# Patient Record
Sex: Male | Born: 1960 | Race: Black or African American | Hispanic: No | Marital: Single | Smoking: Former smoker
Health system: Southern US, Community
[De-identification: ages and names within clinical notes are randomized; demographics above are authoritative.]

## PROBLEM LIST (undated history)

## (undated) DIAGNOSIS — K759 Inflammatory liver disease, unspecified: Secondary | ICD-10-CM

## (undated) DIAGNOSIS — F1010 Alcohol abuse, uncomplicated: Secondary | ICD-10-CM

## (undated) DIAGNOSIS — I10 Essential (primary) hypertension: Secondary | ICD-10-CM

## (undated) DIAGNOSIS — K7460 Unspecified cirrhosis of liver: Secondary | ICD-10-CM

## (undated) DIAGNOSIS — M109 Gout, unspecified: Secondary | ICD-10-CM

## (undated) DIAGNOSIS — M1990 Unspecified osteoarthritis, unspecified site: Secondary | ICD-10-CM

## (undated) HISTORY — PX: COLONOSCOPY: SHX174

## (undated) HISTORY — DX: Unspecified cirrhosis of liver: K74.60

## (undated) HISTORY — PX: CERVICAL FUSION: SHX112

## (undated) HISTORY — DX: Inflammatory liver disease, unspecified: K75.9

## (undated) HISTORY — DX: Unspecified osteoarthritis, unspecified site: M19.90

## (undated) HISTORY — PX: POLYPECTOMY: SHX149

## (undated) HISTORY — DX: Alcohol abuse, uncomplicated: F10.10

---

## 1999-12-24 ENCOUNTER — Encounter: Payer: Self-pay | Admitting: Emergency Medicine

## 1999-12-24 ENCOUNTER — Emergency Department (HOSPITAL_COMMUNITY): Admission: EM | Admit: 1999-12-24 | Discharge: 1999-12-24 | Payer: Self-pay | Admitting: Emergency Medicine

## 2000-06-21 ENCOUNTER — Emergency Department (HOSPITAL_COMMUNITY): Admission: EM | Admit: 2000-06-21 | Discharge: 2000-06-21 | Payer: Self-pay | Admitting: *Deleted

## 2007-04-21 ENCOUNTER — Emergency Department (HOSPITAL_COMMUNITY): Admission: EM | Admit: 2007-04-21 | Discharge: 2007-04-21 | Payer: Self-pay | Admitting: Emergency Medicine

## 2007-04-27 ENCOUNTER — Emergency Department (HOSPITAL_COMMUNITY): Admission: EM | Admit: 2007-04-27 | Discharge: 2007-04-27 | Payer: Self-pay | Admitting: Emergency Medicine

## 2007-08-24 ENCOUNTER — Emergency Department (HOSPITAL_COMMUNITY): Admission: EM | Admit: 2007-08-24 | Discharge: 2007-08-24 | Payer: Self-pay | Admitting: Family Medicine

## 2007-09-04 ENCOUNTER — Emergency Department (HOSPITAL_COMMUNITY): Admission: EM | Admit: 2007-09-04 | Discharge: 2007-09-04 | Payer: Self-pay | Admitting: Emergency Medicine

## 2008-06-30 ENCOUNTER — Ambulatory Visit: Payer: Self-pay | Admitting: Family Medicine

## 2008-06-30 ENCOUNTER — Ambulatory Visit: Payer: Self-pay | Admitting: *Deleted

## 2008-06-30 LAB — CONVERTED CEMR LAB
ALT: 92 units/L — ABNORMAL HIGH (ref 0–53)
AST: 70 units/L — ABNORMAL HIGH (ref 0–37)
Albumin: 3.7 g/dL (ref 3.5–5.2)
Alkaline Phosphatase: 113 units/L (ref 39–117)
Amphetamine Screen, Ur: NEGATIVE
BUN: 11 mg/dL (ref 6–23)
Barbiturate Quant, Ur: NEGATIVE
Basophils Absolute: 0 10*3/uL (ref 0.0–0.1)
Basophils Relative: 1 % (ref 0–1)
Benzodiazepines.: NEGATIVE
CO2: 22 meq/L (ref 19–32)
Calcium: 9.1 mg/dL (ref 8.4–10.5)
Chloride: 107 meq/L (ref 96–112)
Cocaine Metabolites: POSITIVE — AB
Creatinine, Ser: 1.43 mg/dL (ref 0.40–1.50)
Creatinine,U: 228.2 mg/dL
Eosinophils Absolute: 0.4 10*3/uL (ref 0.0–0.7)
Eosinophils Relative: 6 % — ABNORMAL HIGH (ref 0–5)
Glucose, Bld: 87 mg/dL (ref 70–99)
HCT: 44.2 % (ref 39.0–52.0)
Hemoglobin: 14.8 g/dL (ref 13.0–17.0)
Lymphocytes Relative: 49 % — ABNORMAL HIGH (ref 12–46)
Lymphs Abs: 3.6 10*3/uL (ref 0.7–4.0)
MCHC: 33.5 g/dL (ref 30.0–36.0)
MCV: 85.8 fL (ref 78.0–100.0)
Marijuana Metabolite: NEGATIVE
Methadone: NEGATIVE
Monocytes Absolute: 0.7 10*3/uL (ref 0.1–1.0)
Monocytes Relative: 9 % (ref 3–12)
Neutro Abs: 2.6 10*3/uL (ref 1.7–7.7)
Neutrophils Relative %: 36 % — ABNORMAL LOW (ref 43–77)
Opiate Screen, Urine: NEGATIVE
Phencyclidine (PCP): NEGATIVE
Platelets: 266 10*3/uL (ref 150–400)
Potassium: 4.6 meq/L (ref 3.5–5.3)
Propoxyphene: NEGATIVE
RBC: 5.15 M/uL (ref 4.22–5.81)
RDW: 13.6 % (ref 11.5–15.5)
Sed Rate: 2 mm/hr (ref 0–16)
Sodium: 142 meq/L (ref 135–145)
TSH: 1.417 microintl units/mL (ref 0.350–4.50)
Total Bilirubin: 0.5 mg/dL (ref 0.3–1.2)
Total Protein: 7.1 g/dL (ref 6.0–8.3)
Uric Acid, Serum: 8.4 mg/dL — ABNORMAL HIGH (ref 4.0–7.8)
WBC: 7.4 10*3/uL (ref 4.0–10.5)

## 2008-07-03 ENCOUNTER — Encounter: Payer: Self-pay | Admitting: Family Medicine

## 2008-07-03 LAB — CONVERTED CEMR LAB
HCV Ab: REACTIVE — AB
Hep B S Ab: NEGATIVE
Hepatitis B Surface Ag: NEGATIVE

## 2008-08-06 ENCOUNTER — Ambulatory Visit: Payer: Self-pay | Admitting: Internal Medicine

## 2008-08-06 ENCOUNTER — Encounter: Payer: Self-pay | Admitting: Family Medicine

## 2008-08-06 LAB — CONVERTED CEMR LAB
BUN: 8 mg/dL (ref 6–23)
CO2: 22 meq/L (ref 19–32)
Calcium: 9.2 mg/dL (ref 8.4–10.5)
Chloride: 104 meq/L (ref 96–112)
Cholesterol: 180 mg/dL (ref 0–200)
Creatinine, Ser: 1.17 mg/dL (ref 0.40–1.50)
Glucose, Bld: 102 mg/dL — ABNORMAL HIGH (ref 70–99)
HDL: 51 mg/dL (ref >39)
LDL Cholesterol: 93 mg/dL (ref 0–99)
Potassium: 3.5 meq/L (ref 3.5–5.3)
Sodium: 139 meq/L (ref 135–145)
Total CHOL/HDL Ratio: 3.5
Triglycerides: 178 mg/dL — ABNORMAL HIGH (ref <150)
VLDL: 36 mg/dL (ref 0–40)

## 2008-12-05 ENCOUNTER — Ambulatory Visit: Payer: Self-pay | Admitting: Internal Medicine

## 2009-09-25 ENCOUNTER — Ambulatory Visit: Payer: Self-pay | Admitting: Family Medicine

## 2009-10-26 ENCOUNTER — Ambulatory Visit: Payer: Self-pay | Admitting: Internal Medicine

## 2009-10-26 LAB — CONVERTED CEMR LAB
ALT: 99 units/L — ABNORMAL HIGH (ref 0–53)
AST: 84 units/L — ABNORMAL HIGH (ref 0–37)
Albumin: 3.9 g/dL (ref 3.5–5.2)
Alkaline Phosphatase: 89 units/L (ref 39–117)
Amphetamine Screen, Ur: NEGATIVE
BUN: 15 mg/dL (ref 6–23)
Barbiturate Quant, Ur: NEGATIVE
Basophils Absolute: 0.1 10*3/uL (ref 0.0–0.1)
Basophils Relative: 1 % (ref 0–1)
Benzodiazepines.: NEGATIVE
CO2: 22 meq/L (ref 19–32)
Calcium: 9.4 mg/dL (ref 8.4–10.5)
Chloride: 105 meq/L (ref 96–112)
Cholesterol: 119 mg/dL (ref 0–200)
Cocaine Metabolites: NEGATIVE
Creatinine, Ser: 1.19 mg/dL (ref 0.40–1.50)
Creatinine,U: 228.5 mg/dL
Eosinophils Absolute: 0.2 10*3/uL (ref 0.0–0.7)
Eosinophils Relative: 4 % (ref 0–5)
Glucose, Bld: 106 mg/dL — ABNORMAL HIGH (ref 70–99)
HCT: 44.3 % (ref 39.0–52.0)
HDL: 37 mg/dL — ABNORMAL LOW (ref >39)
Hemoglobin: 15.3 g/dL (ref 13.0–17.0)
LDL Cholesterol: 43 mg/dL (ref 0–99)
Lymphocytes Relative: 52 % — ABNORMAL HIGH (ref 12–46)
Lymphs Abs: 3.2 10*3/uL (ref 0.7–4.0)
MCHC: 34.5 g/dL (ref 30.0–36.0)
MCV: 82.3 fL (ref 78.0–100.0)
Marijuana Metabolite: NEGATIVE
Methadone: NEGATIVE
Monocytes Absolute: 0.5 10*3/uL (ref 0.1–1.0)
Monocytes Relative: 8 % (ref 3–12)
Neutro Abs: 2.3 10*3/uL (ref 1.7–7.7)
Neutrophils Relative %: 36 % — ABNORMAL LOW (ref 43–77)
Opiate Screen, Urine: NEGATIVE
Phencyclidine (PCP): NEGATIVE
Platelets: 241 10*3/uL (ref 150–400)
Potassium: 4.2 meq/L (ref 3.5–5.3)
Propoxyphene: NEGATIVE
RBC: 5.38 M/uL (ref 4.22–5.81)
RDW: 12.5 % (ref 11.5–15.5)
Sodium: 140 meq/L (ref 135–145)
TSH: 3.154 microintl units/mL (ref 0.350–4.500)
Total Bilirubin: 0.5 mg/dL (ref 0.3–1.2)
Total CHOL/HDL Ratio: 3.2
Total Protein: 7.8 g/dL (ref 6.0–8.3)
Triglycerides: 195 mg/dL — ABNORMAL HIGH (ref <150)
Uric Acid, Serum: 8 mg/dL — ABNORMAL HIGH (ref 4.0–7.8)
VLDL: 39 mg/dL (ref 0–40)
WBC: 6.2 10*3/uL (ref 4.0–10.5)

## 2009-11-18 ENCOUNTER — Ambulatory Visit: Payer: Self-pay | Admitting: Family Medicine

## 2009-12-02 ENCOUNTER — Ambulatory Visit: Payer: Self-pay | Admitting: Family Medicine

## 2009-12-02 LAB — CONVERTED CEMR LAB
AST: 107 units/L — ABNORMAL HIGH (ref 0–37)
HDL: 39 mg/dL — ABNORMAL LOW (ref >39)
LDL Cholesterol: 85 mg/dL (ref 0–99)
Total CHOL/HDL Ratio: 3.8
VLDL: 25 mg/dL (ref 0–40)

## 2009-12-10 ENCOUNTER — Ambulatory Visit: Payer: Self-pay | Admitting: Family Medicine

## 2009-12-24 ENCOUNTER — Ambulatory Visit: Payer: Self-pay | Admitting: Family Medicine

## 2009-12-24 LAB — CONVERTED CEMR LAB
AST: 89 units/L — ABNORMAL HIGH (ref 0–37)
Albumin: 4.1 g/dL (ref 3.5–5.2)
Alkaline Phosphatase: 92 units/L (ref 39–117)
Barbiturate Quant, Ur: NEGATIVE
Cocaine Metabolites: POSITIVE — AB
Creatinine,U: 352.3 mg/dL
Opiate Screen, Urine: NEGATIVE
Total Bilirubin: 0.6 mg/dL (ref 0.3–1.2)
Total Protein: 8.5 g/dL — ABNORMAL HIGH (ref 6.0–8.3)

## 2010-01-04 ENCOUNTER — Ambulatory Visit: Payer: Self-pay | Admitting: Internal Medicine

## 2010-01-14 ENCOUNTER — Ambulatory Visit: Payer: Self-pay | Admitting: Family Medicine

## 2010-01-18 ENCOUNTER — Ambulatory Visit: Payer: Self-pay | Admitting: Internal Medicine

## 2010-03-03 ENCOUNTER — Ambulatory Visit: Payer: Self-pay | Admitting: Family Medicine

## 2010-03-29 ENCOUNTER — Ambulatory Visit: Payer: Self-pay | Admitting: Family Medicine

## 2010-04-26 ENCOUNTER — Ambulatory Visit: Payer: Self-pay | Admitting: Internal Medicine

## 2010-04-26 ENCOUNTER — Encounter (INDEPENDENT_AMBULATORY_CARE_PROVIDER_SITE_OTHER): Payer: Self-pay | Admitting: Adult Health

## 2010-04-26 LAB — CONVERTED CEMR LAB
ALT: 96 units/L — ABNORMAL HIGH (ref 0–53)
CO2: 22 meq/L (ref 19–32)
Creatinine, Ser: 1 mg/dL (ref 0.40–1.50)
HCV Ab: REACTIVE — AB
Hep A IgM: NEGATIVE
Hep B C IgM: NEGATIVE
Hepatitis B Surface Ag: NEGATIVE
Total Bilirubin: 0.4 mg/dL (ref 0.3–1.2)
Uric Acid, Serum: 8.2 mg/dL — ABNORMAL HIGH (ref 4.0–7.8)

## 2010-05-27 ENCOUNTER — Ambulatory Visit: Payer: Self-pay | Admitting: Family Medicine

## 2010-11-02 ENCOUNTER — Encounter (INDEPENDENT_AMBULATORY_CARE_PROVIDER_SITE_OTHER): Payer: Self-pay | Admitting: Internal Medicine

## 2010-11-02 LAB — CONVERTED CEMR LAB
ALT: 126 units/L — ABNORMAL HIGH (ref 0–53)
AST: 110 units/L — ABNORMAL HIGH (ref 0–37)
Alkaline Phosphatase: 91 units/L (ref 39–117)
Chloride: 105 meq/L (ref 96–112)
Creatinine, Ser: 1.25 mg/dL (ref 0.40–1.50)
Total Bilirubin: 0.5 mg/dL (ref 0.3–1.2)

## 2010-11-05 ENCOUNTER — Ambulatory Visit (HOSPITAL_COMMUNITY)
Admission: RE | Admit: 2010-11-05 | Discharge: 2010-11-05 | Payer: Self-pay | Source: Home / Self Care | Attending: Family Medicine | Admitting: Family Medicine

## 2010-11-26 ENCOUNTER — Encounter (INDEPENDENT_AMBULATORY_CARE_PROVIDER_SITE_OTHER): Payer: Self-pay | Admitting: *Deleted

## 2010-11-26 LAB — CONVERTED CEMR LAB
CO2: 26 meq/L (ref 19–32)
Chloride: 103 meq/L (ref 96–112)
Glucose, Bld: 83 mg/dL (ref 70–99)
Magnesium: 1.9 mg/dL (ref 1.5–2.5)
Potassium: 4.5 meq/L (ref 3.5–5.3)
Sodium: 138 meq/L (ref 135–145)
Total CK: 279 units/L — ABNORMAL HIGH (ref 7–232)

## 2010-12-22 ENCOUNTER — Encounter: Payer: Self-pay | Admitting: Internal Medicine

## 2010-12-22 LAB — CONVERTED CEMR LAB
CRP: 0.1 mg/dL (ref <0.6)
Sed Rate: 5 mm/hr (ref 0–16)
TSH: 1.311 microintl units/mL (ref 0.350–4.500)

## 2011-03-30 ENCOUNTER — Inpatient Hospital Stay (INDEPENDENT_AMBULATORY_CARE_PROVIDER_SITE_OTHER)
Admission: RE | Admit: 2011-03-30 | Discharge: 2011-03-30 | Disposition: A | Discharge status: Home / Self Care | Payer: Medicaid Other | Source: Ambulatory Visit | Attending: Family Medicine | Admitting: Family Medicine

## 2011-03-30 DIAGNOSIS — G5600 Carpal tunnel syndrome, unspecified upper limb: Secondary | ICD-10-CM

## 2011-03-30 DIAGNOSIS — I10 Essential (primary) hypertension: Secondary | ICD-10-CM

## 2011-09-01 LAB — I-STAT 8, (EC8 V) (CONVERTED LAB)
BUN: 9
Chloride: 105
HCT: 49
Operator id: 116391
Potassium: 4.3
pCO2, Ven: 47.1
pH, Ven: 7.376 — ABNORMAL HIGH

## 2012-01-25 IMAGING — CR DG KNEE 1-2V*L*
2 series · 2 of 2 positions shown · non-contrast
Comparison: None.

CLINICAL DATA: Chronic knee pain.  History of gout.  No trauma
history.

LEFT KNEE - 1-2 VIEW

[t knee ap left]
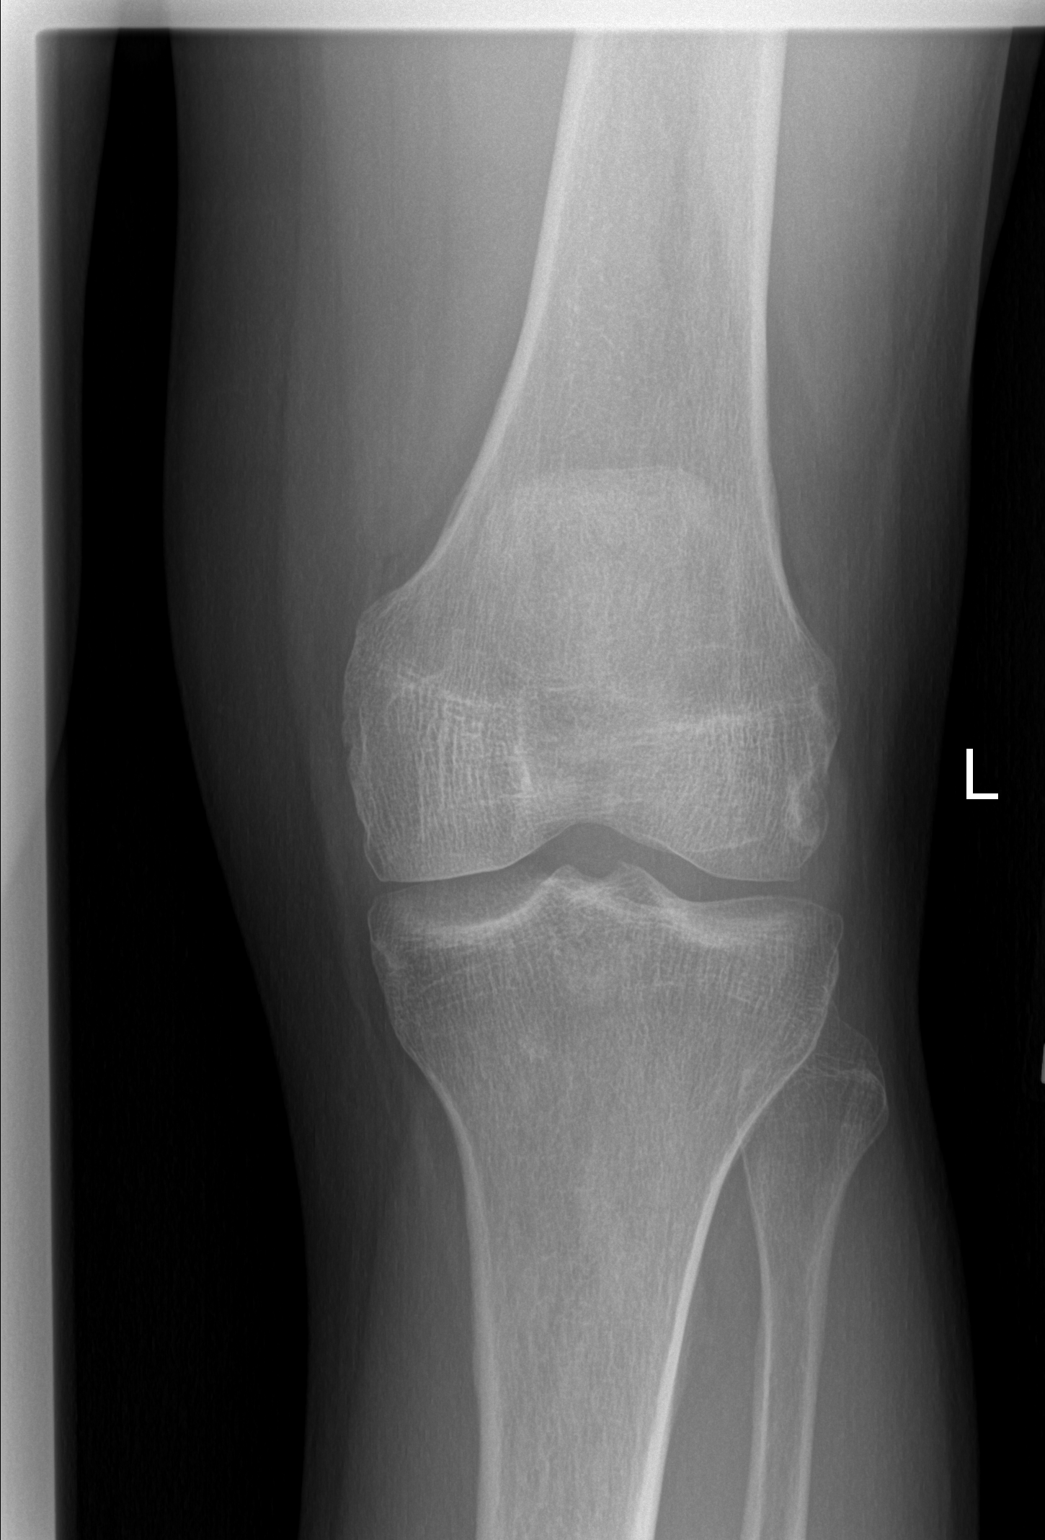

[t knee lat left]
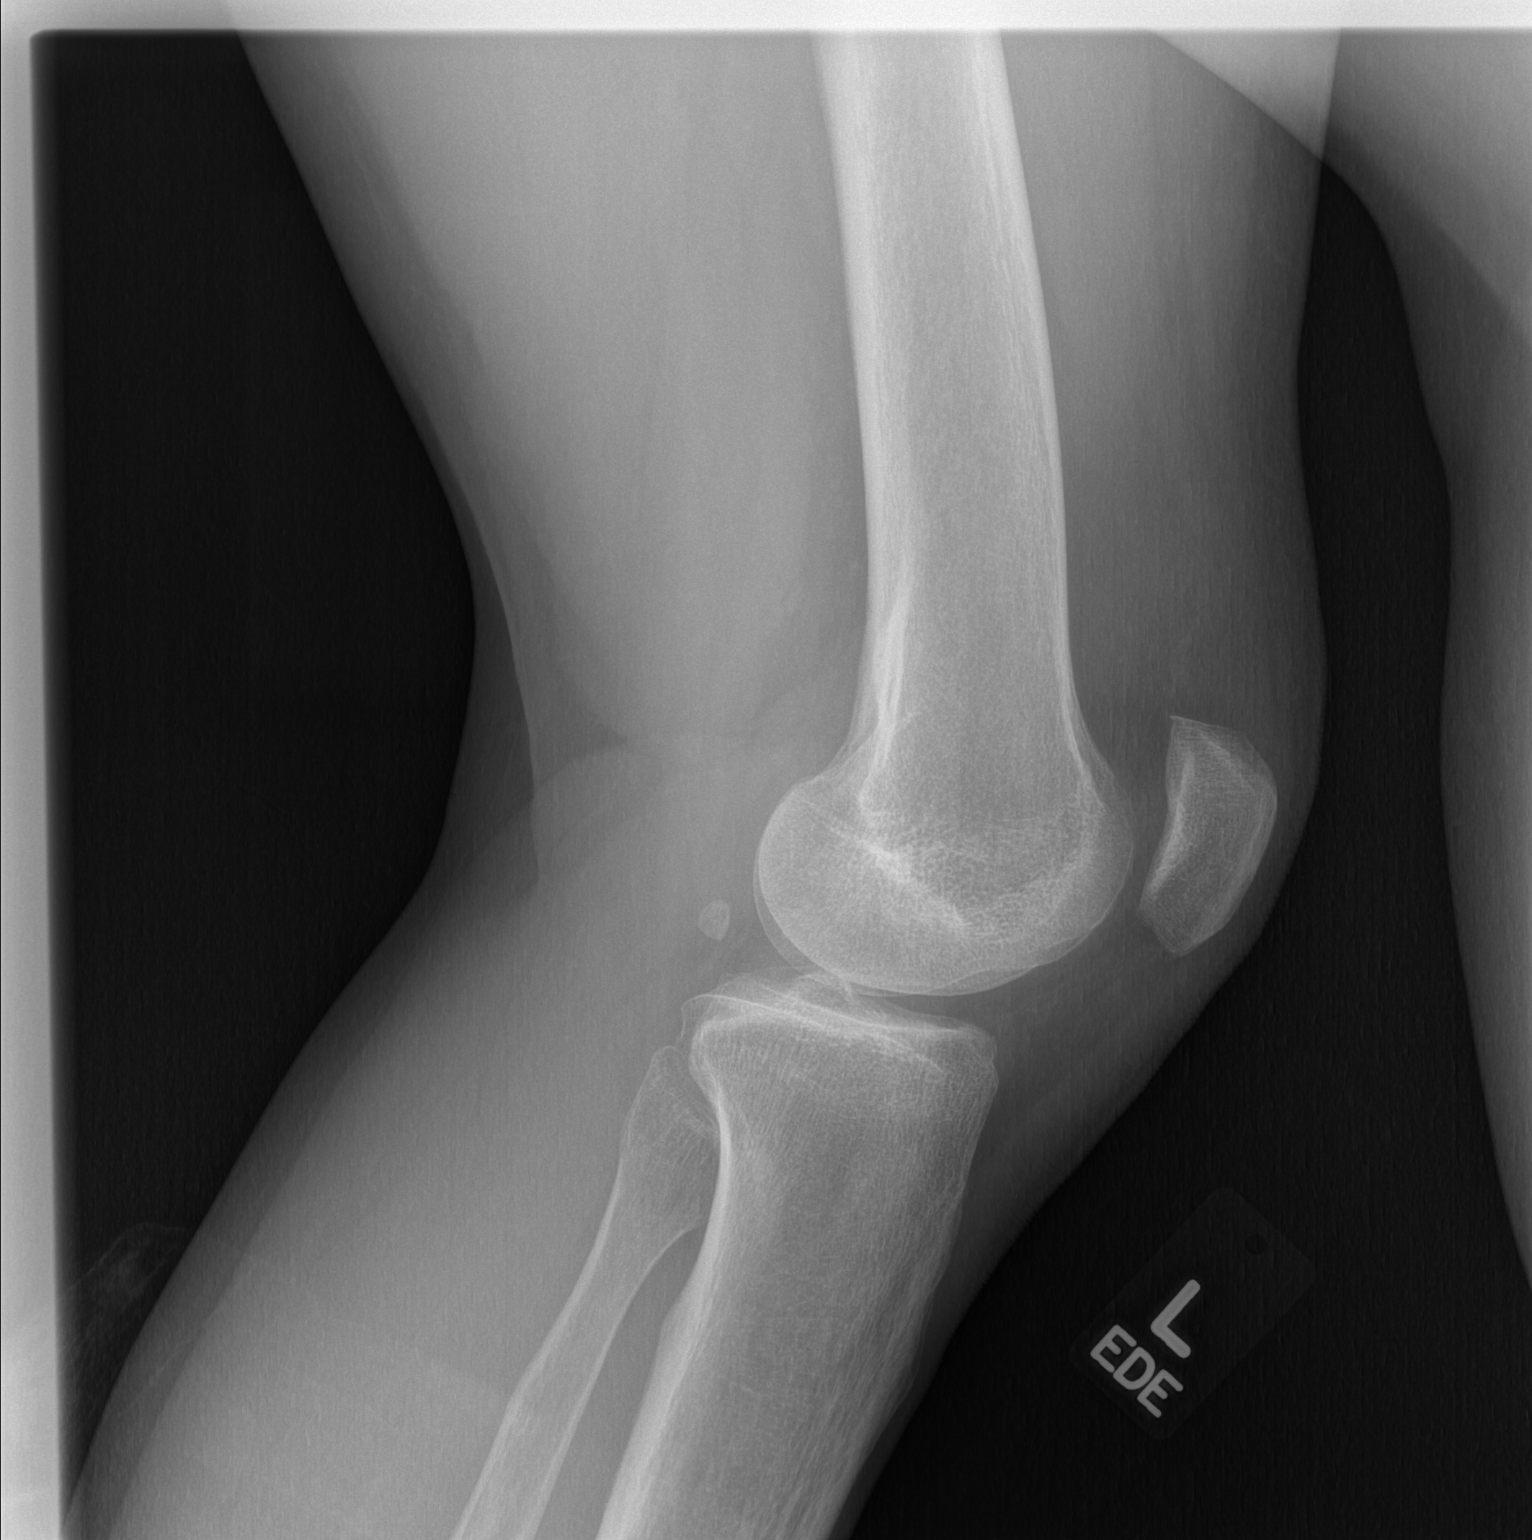

[2 of 2 positions shown; findings below may reference images not displayed]

FINDINGS: AP and lateral views. No acute fracture or dislocation.
Mild patellofemoral osteoarthritis.  No definite joint effusion.
IMPRESSION: Mild patellofemoral osteoarthritis without acute finding.

## 2013-08-16 ENCOUNTER — Emergency Department (HOSPITAL_COMMUNITY)
Admission: EM | Admit: 2013-08-16 | Discharge: 2013-08-17 | Disposition: A | Discharge status: Home / Self Care | Payer: Medicare HMO | Attending: Emergency Medicine | Admitting: Emergency Medicine

## 2013-08-16 ENCOUNTER — Encounter (HOSPITAL_COMMUNITY): Payer: Self-pay | Admitting: *Deleted

## 2013-08-16 DIAGNOSIS — Z79899 Other long term (current) drug therapy: Secondary | ICD-10-CM | POA: Insufficient documentation

## 2013-08-16 DIAGNOSIS — I10 Essential (primary) hypertension: Secondary | ICD-10-CM | POA: Insufficient documentation

## 2013-08-16 DIAGNOSIS — F17200 Nicotine dependence, unspecified, uncomplicated: Secondary | ICD-10-CM | POA: Insufficient documentation

## 2013-08-16 DIAGNOSIS — L738 Other specified follicular disorders: Secondary | ICD-10-CM | POA: Insufficient documentation

## 2013-08-16 HISTORY — DX: Essential (primary) hypertension: I10

## 2013-08-16 MED ORDER — CEPHALEXIN 250 MG PO CAPS
500.0000 mg | ORAL_CAPSULE | Freq: Once | ORAL | Status: AC
  Administered 2013-08-17: 500 mg via ORAL
  Filled 2013-08-16: qty 2

## 2013-08-16 NOTE — ED Notes (Signed)
He has lesions on his head and neck for 2 weeks pain and itching

## 2013-08-17 MED ORDER — CEPHALEXIN 500 MG PO CAPS: 500.0000 mg | ORAL_CAPSULE | Freq: Four times a day (QID) | ORAL | Status: DC

## 2013-08-17 NOTE — ED Provider Notes (Signed)
CSN: [id number]     Arrival date & time 08/16/13  2252 History   First MD Initiated Contact with Patient 08/16/13 2335     Chief Complaint  Patient presents with  . lesions on head and neck    (Consider location/radiation/quality/duration/timing/severity/associated sxs/prior Treatment) HPI Comments: Patient presents to the emergency room tonight with multiple lesions in the scalp beard for the past 2, weeks.  He's been soaking them with blue, alcohol, without resolution  The history is provided by the patient.    Past Medical History  Diagnosis Date  . Hypertension    History reviewed. No pertinent past surgical history. No family history on file. History  Substance Use Topics  . Smoking status: Current Every Day Smoker  . Smokeless tobacco: Not on file  . Alcohol Use: Yes    Review of Systems  Constitutional: Negative for fever and chills.  Musculoskeletal: Negative for myalgias.  Skin: Positive for wound.  Neurological: Negative for headaches.  All other systems reviewed and are negative.    Allergies  Review of patient's allergies indicates no known allergies.  Home Medications   Current Outpatient Rx  Name  Route  Sig  Dispense  Refill  . allopurinol (ZYLOPRIM) 300 MG tablet   Oral   Take 300 mg by mouth daily as needed (for gout).         Marland Kitchen amLODipine (NORVASC) 10 MG tablet   Oral   Take 10 mg by mouth daily.         Marland Kitchen lisinopril (PRINIVIL,ZESTRIL) 40 MG tablet   Oral   Take 40 mg by mouth daily.         . naproxen sodium (ANAPROX) 220 MG tablet   Oral   Take 220 mg by mouth daily as needed (for pain).         . Vitamin D, Ergocalciferol, (DRISDOL) 50000 UNITS CAPS capsule   Oral   Take 50,000 Units by mouth every 7 (seven) days. On wednesdays         . cephALEXin (KEFLEX) 500 MG capsule   Oral   Take 1 capsule (500 mg total) by mouth 4 (four) times daily.   39 capsule   0    BP 136/81  Pulse 76  Temp(Src) 98.3 F (36.8 C)  Resp  18  SpO2 97% Physical Exam  Nursing note and vitals reviewed. Constitutional: He is oriented to person, place, and time. He appears well-developed and well-nourished.  HENT:  Head: Normocephalic and atraumatic.  Multiple lesions within the scalp and facial hair of the beard mustache and neck  Eyes: Pupils are equal, round, and reactive to light.  Neck: Normal range of motion.  Cardiovascular: Normal rate and regular rhythm.   Pulmonary/Chest: Effort normal and breath sounds normal.  Lymphadenopathy:    He has cervical adenopathy.  Neurological: He is alert and oriented to person, place, and time.  Skin: Skin is warm. No erythema.    ED Course  Procedures (including critical care time) Labs Review Labs Reviewed - No data to display Imaging Review No results found.  MDM   1. Folliculitis barbae    Patient has folliculitis.  I will start him on Keflex 500 mg     Arman Filter, NP 08/17/13 0008

## 2013-08-17 NOTE — ED Provider Notes (Signed)
Medical screening examination/treatment/procedure(s) were performed by non-physician practitioner and as supervising physician I was immediately available for consultation/collaboration.   Richardean Canal, MD 08/17/13 [phone]

## 2015-02-12 DIAGNOSIS — Z0000 Encounter for general adult medical examination without abnormal findings: Secondary | ICD-10-CM | POA: Diagnosis not present

## 2015-04-14 DIAGNOSIS — R799 Abnormal finding of blood chemistry, unspecified: Secondary | ICD-10-CM | POA: Diagnosis not present

## 2015-04-14 DIAGNOSIS — I10 Essential (primary) hypertension: Secondary | ICD-10-CM | POA: Diagnosis not present

## 2015-05-29 ENCOUNTER — Other Ambulatory Visit (INDEPENDENT_AMBULATORY_CARE_PROVIDER_SITE_OTHER): Payer: Medicare HMO

## 2015-05-29 DIAGNOSIS — B182 Chronic viral hepatitis C: Secondary | ICD-10-CM

## 2015-05-29 LAB — CBC WITH DIFFERENTIAL/PLATELET
BASOS ABS: 0.1 10*3/uL (ref 0.0–0.1)
Basophils Relative: 1 % (ref 0–1)
EOS PCT: 4 % (ref 0–5)
Eosinophils Absolute: 0.3 10*3/uL (ref 0.0–0.7)
HCT: 39.4 % (ref 39.0–52.0)
Hemoglobin: 13.5 g/dL (ref 13.0–17.0)
LYMPHS PCT: 54 % — AB (ref 12–46)
Lymphs Abs: 3.5 10*3/uL (ref 0.7–4.0)
MCH: 29.3 pg (ref 26.0–34.0)
MCHC: 34.3 g/dL (ref 30.0–36.0)
MCV: 85.5 fL (ref 78.0–100.0)
MPV: 11.2 fL (ref 8.6–12.4)
Monocytes Absolute: 0.5 10*3/uL (ref 0.1–1.0)
Monocytes Relative: 8 % (ref 3–12)
Neutro Abs: 2.1 10*3/uL (ref 1.7–7.7)
Neutrophils Relative %: 33 % — ABNORMAL LOW (ref 43–77)
PLATELETS: 142 10*3/uL — AB (ref 150–400)
RBC: 4.61 MIL/uL (ref 4.22–5.81)
RDW: 14.2 % (ref 11.5–15.5)
WBC: 6.4 10*3/uL (ref 4.0–10.5)

## 2015-05-29 LAB — IRON: Iron: 121 ug/dL (ref 42–165)

## 2015-05-29 LAB — PROTIME-INR
INR: 1.09 (ref <1.50)
Prothrombin Time: 14.1 seconds (ref 11.6–15.2)

## 2015-05-29 LAB — HEPATITIS A ANTIBODY, TOTAL: Hep A Total Ab: NONREACTIVE

## 2015-05-29 LAB — HEPATITIS B SURFACE ANTIBODY,QUALITATIVE: HEP B S AB: NEGATIVE

## 2015-05-29 LAB — HEPATITIS B SURFACE ANTIGEN: HEP B S AG: NEGATIVE

## 2015-05-29 LAB — HEPATITIS B CORE ANTIBODY, TOTAL: HEP B C TOTAL AB: NONREACTIVE

## 2015-05-29 LAB — HIV ANTIBODY (ROUTINE TESTING W REFLEX): HIV 1&2 Ab, 4th Generation: NONREACTIVE

## 2015-06-01 LAB — ANA: ANA: NEGATIVE

## 2015-06-01 LAB — HEPATITIS C RNA QUANTITATIVE
HCV Quantitative Log: 6.52 {Log} — ABNORMAL HIGH (ref <1.18)
HCV Quantitative: 3313707 IU/mL — ABNORMAL HIGH (ref <15)

## 2015-06-03 LAB — HEPATITIS C GENOTYPE

## 2015-06-15 DIAGNOSIS — B1920 Unspecified viral hepatitis C without hepatic coma: Secondary | ICD-10-CM | POA: Diagnosis not present

## 2015-06-15 DIAGNOSIS — I10 Essential (primary) hypertension: Secondary | ICD-10-CM | POA: Diagnosis not present

## 2015-06-24 ENCOUNTER — Encounter: Payer: Medicare HMO | Admitting: Internal Medicine

## 2015-07-29 ENCOUNTER — Encounter: Payer: Self-pay | Admitting: Internal Medicine

## 2015-07-29 ENCOUNTER — Ambulatory Visit (INDEPENDENT_AMBULATORY_CARE_PROVIDER_SITE_OTHER): Payer: Medicare HMO | Admitting: Internal Medicine

## 2015-07-29 VITALS — BP 144/86 | HR 98 | Temp 98.0°F | Ht 68.0 in | Wt 218.0 lb

## 2015-07-29 DIAGNOSIS — J302 Other seasonal allergic rhinitis: Secondary | ICD-10-CM | POA: Diagnosis not present

## 2015-07-29 DIAGNOSIS — B182 Chronic viral hepatitis C: Secondary | ICD-10-CM | POA: Diagnosis not present

## 2015-07-29 MED ORDER — LEDIPASVIR-SOFOSBUVIR 90-400 MG PO TABS: 1.0000 | ORAL_TABLET | Freq: Every day | ORAL | Status: DC

## 2015-07-29 NOTE — Progress Notes (Signed)
HPI:  +Lee Johnston is a 54 y.o. male who presents for initial evaluation and management of chronic hepatitis C.  Patient tested positive this year during routine screening. Hepatitis C risk factors present are: none. Patient denies history of blood transfusion, intranasal drug use, IV drug abuse, multiple sexual partners, renal dialysis, sexual contact with person with liver disease, tattoos, was in the [employer] in the 1970s. Patient has had other studies performed. Results: hepatitis C RNA by PCR, result: positive. Patient has not had prior treatment for Hepatitis C. Patient does not have a past history of liver disease. Patient does not have a family history of liver disease.  Does drink some, no drugs.     Patient does not have documented immunity to Hepatitis A. Patient does not have documented immunity to Hepatitis B.    Review of Systems:  Constitutional: Negative for fatigue, weight loss.  HENT: Negative for hearing loss, ear pain, neck pain, tinnitus and ear discharge.  Eyes: Negative for icterus, discharge and redness.  Respiratory: Negative fordypsnea, wheezing.  Cardiovascular: Negative for chest pain, palpitations, orthopnea, claudication and leg swelling.  Gastrointestinal: Negative for nausea, vomiting, abdominal distention and abdominal pain.Negative for  Genitourinary: Negative for dysuria, urgency, frequency, hematuria and flank pain.  Musculoskeletal: Negative for arthralgias, arthritis Skin: Negative for itching and rash. Neurological: Negative for dizziness and weakness. Endo/Heme/Allergies: Negative for environmental allergies and polydipsia. Does not bruise/bleed easily.      Past Medical History  Diagnosis Date  . Hypertension     Prior to Admission medications   Medication Sig Start Date End Date Taking? Authorizing Provider  allopurinol (ZYLOPRIM) 300 MG tablet Take 300 mg by mouth daily as needed (for gout).    Historical Provider, MD  amLODipine  (NORVASC) 10 MG tablet Take 10 mg by mouth daily.    Historical Provider, MD  cephALEXin (KEFLEX) 500 MG capsule Take 1 capsule (500 mg total) by mouth 4 (four) times daily. 08/17/13   Earley Favor, NP  Ledipasvir-Sofosbuvir (HARVONI) 90-400 MG TABS Take 1 tablet by mouth daily. 07/29/15   Gardiner Barefoot, MD  lisinopril (PRINIVIL,ZESTRIL) 40 MG tablet Take 40 mg by mouth daily.    Historical Provider, MD  naproxen sodium (ANAPROX) 220 MG tablet Take 220 mg by mouth daily as needed (for pain).    Historical Provider, MD  Vitamin D, Ergocalciferol, (DRISDOL) 50000 UNITS CAPS capsule Take 50,000 Units by mouth every 7 (seven) days. On wednesdays    Historical Provider, MD    No Known Allergies  Social History  Substance Use Topics  . Smoking status: Current Every Day Smoker -- 0.25 packs/day    Types: Cigarettes  . Smokeless tobacco: Never Used  . Alcohol Use: 0.0 oz/week    0 Standard drinks or equivalent per week     Comment: states drinks wine and beer everyday; liquor occasional   FHx: Father, deceased from throat cancer at 23; mother alive at 21, HTN   Objective:   Filed Vitals:   07/29/15 1405  BP: 144/86  Pulse: 98  Temp: 98 F (36.7 C)   GEN: in no apparent distress and alert HEENT: anicteric Cardiac: Cor RRR and No murmurs Lungs: clear Abdomen: Bowel sounds are normal, liver is not enlarged, spleen is not enlarged Ext: peripheral pulses normal, no pedal edema, no clubbing or cyanosis Skin: negative for - jaundice, spider hemangioma, telangiectasia, palmar erythema, ecchymosis and atrophy Musculoskeletal: no joint swelling  Laboratory Genotype:  Lab Results  Component Value Date  HCVGENOTYPE 1a 05/29/2015   HCV viral load:  Lab Results  Component Value Date   HCVQUANT 1610960* 05/29/2015   Lab Results  Component Value Date   WBC 6.4 05/29/2015   HGB 13.5 05/29/2015   HCT 39.4 05/29/2015   MCV 85.5 05/29/2015   PLT 142* 05/29/2015    Lab Results  Component  Value Date   CREATININE 1.23 11/26/2010   BUN 12 11/26/2010   NA 138 11/26/2010   K 4.5 11/26/2010   CL 103 11/26/2010   CO2 26 11/26/2010    Lab Results  Component Value Date   ALT 126* 11/02/2010   AST 110* 11/02/2010   ALKPHOS 91 11/02/2010     CHILD-PUGH A  5-6 points: Child class A 7-9 points: Child class B 10-15 points: Child class C  Lab Results  Component Value Date   INR 1.09 05/29/2015   BILITOT 0.5 11/02/2010   ALBUMIN 4.1 11/02/2010    Encephalopathy None (1 point) Grade 1: Altered mood/confusion (2 points) Grade 2: Inappropriate behavior, impending stupor, somnolence (2 points) Grade 3: Markedly confused, stuporous but arousable (3 points) Grade 4: Comatose/unresponsive (3 points) Ascites Absent (1 point) Slight (2 points) Moderate (3 points) Bilirubin  < 2 mg/dL (1 point) 2-3 mg/dL (2 points) > 3 mg/dL (3 points) Albumin > 3.5 g/dL (1 point) 4.5-4.0 g/dL (2 points) < 2.8 g/dL (3 points) Prothrombin time prolongation Less than 4 seconds above control/INR < 1.7 (1 point) 4-6 seconds above control/INR 1.7-2.3 (2 points) More than 6 seconds above control/INR > 2.3 (3 points)   Assessment: Chronic Hepatitis C genotype 1a I discussed with the patient the natural history and progression of chronic hepatitis C infection including about 30% of people who develop cirrhosis of the liver and once cirrhosis is established there is a 2-7% risk per year of liver cancer and liver failure.    Plan: 1) Patient counseled extensively on limiting acetaminophen to no more than 2 grams daily, avoidance of alcohol. 2) Transmission discussed with patient including sexual transmission, sharing razors and toothbrush.   3) Will need referral to gastroenterology if concern for cirrhosis 4) Will need referral for substance abuse counseling: yes, to help with not drinking at all 5) Will prescribe Harvoni for 12 weeks  6) Hepatitis A vaccine Yes.    Wants to defer until  next visit 7) Hepatitis B vaccine Yes.   8) Pneumovax vaccine if concern for cirrhosis 9) will follow up after elastography

## 2015-07-29 NOTE — Patient Instructions (Signed)
Date 07/29/2015  Dear [name], As discussed in the ID Clinic, your hepatitis C therapy will include the following medications:          Harvoni /400mg  tablet:           Take 1 tablet by mouth once daily   Please note that ALL MEDICATIONS WILL START ON THE SAME DATE for a total of 12 weeks. ---------------------------------------------------------------- Your HCV Treatment Start Date: TBA   Your HCV genotype:  1a    Liver Fibrosis: TBD    ---------------------------------------------------------------- YOUR PHARMACY CONTACT:   [facility] [address] Phone: [phone] Hours: Monday to Friday 7:30 am to 6:00 pm   Please always contact your pharmacy at least 3-4 business days before you run out of medications to ensure your next month's medication is ready or 1 week prior to running out if you receive it by mail.  Remember, each prescription is for 28 days. ---------------------------------------------------------------- GENERAL NOTES REGARDING YOUR HEPATITIS C MEDICATION:  SOFOSBUVIR/LEDIPASVIR (HARVONI): - Harvoni tablet is taken daily with OR without food. - The tablets are orange. - The tablets should be stored at room temperature.  - Acid reducing agents such as H2 blockers (ie. Pepcid (famotidine), Zantac (ranitidine), Tagamet (cimetidine), Axid (nizatidine) and proton pump inhibitors (ie. Prilosec (omeprazole), Protonix (pantoprazole), Nexium (esomeprazole), or Aciphex (rabeprazole)) can decrease effectiveness of Harvoni. Do not take until you have discussed with a health care provider.    -Antacids that contain magnesium and/or aluminum hydroxide (ie. Milk of Magensia, Rolaids, Gaviscon, Maalox, Mylanta, an dArthritis Pain Formula)can reduce absorption of Harvoni, so take them at least 4 hours before or after Harvoni.  -Calcium carbonate (calcium supplements or antacids such as Tums, Caltrate,  Os-Cal)needs to be taken at least 4 hours hours before or after Harvoni.  -St. John's wort or any products that contain St. John's wort like some herbal supplements  Please inform the office prior to starting any of these medications.  - The common side effects associated with Harvoni include:      1. Fatigue      2. Headache      3. Nausea      4. Diarrhea      5. Insomnia   Support Path is a suite of resources designed to help patients start with HARVONI and move toward treatment completion GETTING STARTED Support Path helps patients access therapy and get off to an efficient start  Benefits investigation and prior authorization support Co-pay and other financial assistance A specialty pharmacy finder CO-PAY COUPON The HARVONI co-pay coupon may help eligible patients lower their out-of-pocket costs. With a co-pay coupon, most eligible patients may pay no more than $5 per co-pay (restrictions apply) www.harvoni.com call (860)554-1357 Not valid for patients enrolled in government healthcare prescription drug programs, such as Medicare Part D and Medicaid. Patients in the coverage gap known as the "donut hole" also are not eligible The HARVONI co-pay coupon program will cover the out-of-pocket costs for HARVONI prescriptions up to a maximum of 25% of the catalog price of a 12-week regimen of HARVONI  Please note that this only lists the most common side effects and is NOT a comprehensive list of the potential side effects of these medications. For more information, please review the drug information sheets that come with your medication package from the pharmacy.  ---------------------------------------------------------------- GENERAL HELPFUL HINTS ON HCV THERAPY: 1. No alcohol. 2. Protect against sun-sensitivity/sunburns (wear sunglasses, hat, long  sleeves, pants and sunscreen). 3. Stay well-hydrated/well-moisturized. 4. Notify the ID Clinic of any changes in your other  over-the-counter/herbal or prescription medications. 5. If you miss a dose of your medication, take the missed dose as soon as you remember. Return to your regular time/dose schedule the next day.  6.  Do not stop taking your medications without first talking with your healthcare provider. 7.  You may take Tylenol (acetaminophen), as long as the dose is less than 2000 mg (OR no more than 4 tablets of the Tylenol Extra Strengths 500mg  tablet) in 24 hours. 8.  You will need to obtain routine labs and/or office visits at [facility] at weeks 4 and 12 as well as 12 and 24 weeks after completion of treatment.   Scharlene Gloss, [place] [facility] [place], [place]  [id number] [phone]

## 2015-08-05 ENCOUNTER — Ambulatory Visit: Payer: Medicare HMO | Admitting: *Deleted

## 2015-08-05 DIAGNOSIS — F1010 Alcohol abuse, uncomplicated: Secondary | ICD-10-CM

## 2015-08-05 DIAGNOSIS — F1410 Cocaine abuse, uncomplicated: Secondary | ICD-10-CM

## 2015-08-05 NOTE — BH Specialist Note (Signed)
Lee Johnston was present today for his first appointment with counselor. Client was oriented times four with good affect and dress.  Client was alert and talkative.  Client was having to wipe his eyes with a tissue every few minutes because his eyes were watering.  Client shared that he has Hep C and needs substance abuse counseling per Dr. Luciana Axe recommendation.  Client stated that he is drinking about 2 pints a day of wine and 3 25 ounce beers a day every day.  Client says the amount he drinks is dependent on how much money he has on hand. Client says that he has started cutting down on his drinking and has not had a drink in almost a week now.  Client communicated that he is motivated to get clean and desires long term sobriety especially now because of his diagnosis.  Client stated that he was in the [employer] and started drinking a lot then at the age of 65.  Client says that he has been drinking heavily ever since. Client admits that he also smokes cocaine every few months for special occassions like his birthday. Client says that he has been to treatment a couple of times once while in the [employer] and once at [facility] about 8 years ago.  Client shares that the treatment helped but that he always seem to relapse.  Client is now diagnosed with Hep C and is taking his substance use more seriously.  Client report that the cocaine use is not an issue and has no problem not doing it any longer. Counselor recommended that client seek outpatient substance abuse treatment.  Client agreed and made an appointment at [facility] for intake.  Client also made an additional appointment with counselor to meet back in two weeks.   Jenel Lucks, LPCA [facility]

## 2015-08-20 ENCOUNTER — Other Ambulatory Visit: Payer: Commercial Managed Care - HMO

## 2015-08-20 ENCOUNTER — Ambulatory Visit: Payer: Commercial Managed Care - HMO | Admitting: *Deleted

## 2015-08-20 DIAGNOSIS — F1010 Alcohol abuse, uncomplicated: Secondary | ICD-10-CM

## 2015-08-20 NOTE — BH Specialist Note (Signed)
Beacher was here today for his scheduled appointment.  Client was oriented times four with good affect and dress.  Client was alert and talkative.  Client reported that he has not had anything to drink since last visit together.  Counselor gave client props for his efforts towards sobriety. Client shared that he was in pretty good spirits and really felt like working on his health in general.  Client shared how much his excessive drinking had negatively impacted his life and interpersonal relationships. Counselor educated client about willpower verse being smart.  Counselor shared with client that sobriety requires a lifestyle change not just will power stating you will never use again.  Counselor explained to client that the brain is healing and can override will power because of the addictive nature.  Client was encouraged by client to avoid high risk situations and continue meeting to develop skills necessary to stay in recovery successfully.  Client made another appointment for two weeks out and shared that he would be completing his intake for outpatient substance abuse treatment on Monday.  Counselor provided support and encouragement accordingly.  Jodi Herring, LPCA, mA [facility]

## 2015-08-24 DIAGNOSIS — I10 Essential (primary) hypertension: Secondary | ICD-10-CM | POA: Diagnosis not present

## 2015-08-24 DIAGNOSIS — B1920 Unspecified viral hepatitis C without hepatic coma: Secondary | ICD-10-CM | POA: Diagnosis not present

## 2015-08-26 ENCOUNTER — Ambulatory Visit (HOSPITAL_COMMUNITY)
Admission: RE | Admit: 2015-08-26 | Discharge: 2015-08-26 | Disposition: A | Discharge status: Home / Self Care | Payer: Commercial Managed Care - HMO | Source: Ambulatory Visit | Attending: Internal Medicine | Admitting: Internal Medicine

## 2015-08-26 DIAGNOSIS — B182 Chronic viral hepatitis C: Secondary | ICD-10-CM | POA: Diagnosis not present

## 2015-08-26 DIAGNOSIS — K8020 Calculus of gallbladder without cholecystitis without obstruction: Secondary | ICD-10-CM | POA: Diagnosis not present

## 2015-09-02 ENCOUNTER — Encounter: Payer: Self-pay | Admitting: Pharmacy Technician

## 2015-09-02 ENCOUNTER — Ambulatory Visit: Payer: Commercial Managed Care - HMO | Admitting: Pharmacist

## 2015-09-02 ENCOUNTER — Ambulatory Visit: Payer: Commercial Managed Care - HMO | Admitting: *Deleted

## 2015-09-02 DIAGNOSIS — F1010 Alcohol abuse, uncomplicated: Secondary | ICD-10-CM

## 2015-09-02 NOTE — Progress Notes (Signed)
HPI: Lee Johnston is a 54 y.o. male who presents to the [facility] pharmacy clinic for follow-up of his Hepatitis C infection.  Lab Results  Component Value Date   HCVGENOTYPE 1a 05/29/2015   Allergies: No Known Allergies  Past Medical History: Past Medical History  Diagnosis Date  . Hypertension     Social History: Social History   Social History  . Marital Status: Single    Spouse Name: N/A  . Number of Children: N/A  . Years of Education: N/A   Social History Main Topics  . Smoking status: Current Every Day Smoker -- 0.25 packs/day    Types: Cigarettes  . Smokeless tobacco: Never Used  . Alcohol Use: 0.0 oz/week    0 Standard drinks or equivalent per week     Comment: states drinks wine and beer everyday; liquor occasional  . Drug Use: Yes    Special: Cocaine     Comment: patient states used crack cocaine 7 days ago   . Sexual Activity:    Partners: Female   Other Topics Concern  . Not on file   Social History Narrative    Labs: HEP B S AB (no units)  Date Value  05/29/2015 NEG   HEPATITIS B SURFACE AG (no units)  Date Value  05/29/2015 NEGATIVE   HCV AB (no units)  Date Value  04/26/2010 REACTIVE*    Lab Results  Component Value Date   HCVGENOTYPE 1a 05/29/2015    Hepatitis C RNA quantitative Latest Ref Rng 05/29/2015 11/02/2010  HCV Quantitative <15 IU/mL 3313707(H) 1230000(H)  HCV Quantitative Log <1.18 log 10 6.52(H) -    AST (units/L)  Date Value  11/02/2010 110*  04/26/2010 79*  12/24/2009 89*   ALT (units/L)  Date Value  11/02/2010 126*  04/26/2010 96*  12/24/2009 101*   INR (no units)  Date Value  05/29/2015 1.09    CrCl: CrCl cannot be calculated (Unknown ideal weight.).  Fibrosis Score: F3/F4 as assessed by elastography   Previous Treatment Regimen: None  Assessment: [name] presents to the [facility] pharmacy clinic today for follow-up of his Hepatitis C genotype 1a infection.  He has not started Harvoni yet, but he  will pick it up today after noon to begin treatment.  I counseled him on the importance of avoiding acid suppressing medications while taking Harvoni.  I also did a med rec on him and updated his medication list.  I counseled him on potential side effects and emphasized the importance of not missing any doses.  He saw Lennox LaityJodi today and will follow-up with her in 2 weeks.  He sees Dr. Luciana Axeomer again on 10/20 at 10:15am.  Recommendations: Start taking Harvoni today after he picks it up Follow-up with Dr. Luciana Axeomer on 10/20  Cassie L. Roseanne RenoStewart, PharmD PGY2 Infectious Diseases Pharmacy Resident Pager: [phone] 09/02/2015 11:13 AM

## 2015-09-02 NOTE — BH Specialist Note (Signed)
Lee Johnston was present today for his scheduled appointment with counselor.  Client was oriented times four with good affect and dress.  Client was talkative and shared openly.  Client stated that he was feeling good and has been clean for 1 month and two days.  Client stated that he is proud of himself and his family has even been noticing the change in his attitude.  Client indicated that he has continued attending his GED classes in the evening and will be entering the outpatient substance abuse program at [facility] in another week.  Client says that he is using good productive distractions to avoid being tempted to use.  Counselor talked with client today about the "pink cloud".  Counselor educated client on how the pink cloud can cause over confidence and complacency with recovery.  Counselor shared with client to stay proactive with his recovery process.  Client agreed and said that he understood and communicated that he will continue to stay the course fully staying aware of potential dangers/triggers with using. Counselor encouraged client to continue meeting in order to avoid the road to relapse.  Counselor explained that you are either working on recovery or you are working on a relapse.  Client made another appointment with counselor for two weeks out.  Jenel LucksJodi Tobey Lippard, LPCA [facility]

## 2015-09-07 ENCOUNTER — Telehealth: Payer: Self-pay | Admitting: Pharmacist Clinician (PhC)/ Clinical Pharmacy Specialist

## 2015-09-07 NOTE — Telephone Encounter (Signed)
Lee Johnston called to ask if ok to take benadryl for his sinus drainage with the Harvoni. It should be fine but I told him to get some generic claritin first to try first. He said he'd try it.

## 2015-09-09 DIAGNOSIS — F1020 Alcohol dependence, uncomplicated: Secondary | ICD-10-CM | POA: Diagnosis not present

## 2015-09-10 ENCOUNTER — Ambulatory Visit (INDEPENDENT_AMBULATORY_CARE_PROVIDER_SITE_OTHER): Payer: Commercial Managed Care - HMO | Admitting: Internal Medicine

## 2015-09-10 ENCOUNTER — Encounter: Payer: Self-pay | Admitting: Internal Medicine

## 2015-09-10 VITALS — BP 129/88 | HR 56 | Temp 97.7°F | Wt 223.0 lb

## 2015-09-10 DIAGNOSIS — K7400 Hepatic fibrosis, unspecified: Secondary | ICD-10-CM

## 2015-09-10 DIAGNOSIS — B182 Chronic viral hepatitis C: Secondary | ICD-10-CM

## 2015-09-10 DIAGNOSIS — Z23 Encounter for immunization: Secondary | ICD-10-CM

## 2015-09-10 DIAGNOSIS — K7460 Unspecified cirrhosis of liver: Secondary | ICD-10-CM | POA: Diagnosis not present

## 2015-09-10 DIAGNOSIS — F1010 Alcohol abuse, uncomplicated: Secondary | ICD-10-CM | POA: Diagnosis not present

## 2015-09-10 DIAGNOSIS — R188 Other ascites: Secondary | ICD-10-CM

## 2015-09-10 NOTE — Assessment & Plan Note (Signed)
Doing well on Harvoni.  Labs in 3 weeks, will see pharmacist in 4 weeks.  Then can see me after treatment with HCV viral load.  Will get hep B #2 and pneumovax then.  Will start hepatitis A series later.   Hep B #1 today and flu shot.

## 2015-09-10 NOTE — Assessment & Plan Note (Signed)
Doing great with no alcohol over 1 month.  Is continuing to see our substance abuse counselor.

## 2015-09-10 NOTE — Assessment & Plan Note (Signed)
Will send to GI for ? EGD Will get screening ultrasound every 6 months

## 2015-09-10 NOTE — Progress Notes (Signed)
   Subjective:    Patient ID: Lee Johnston, male    DOB: 03/02/1961, 54 y.o.   MRN: [id number]  HPI Here for follow up of hepatitis C and liver fibrosis.  Has Genotype 1a, viral load of [phone],313,707 and started Harvoni about 1 week ago.  Also with alcohol abuse and has been alcohol free over 1 month.  Feels great from both aspects.  Had elastography with F3/4, ultrasound with cirrhosis.  Hepatitis A and B immune.  Unknown duration.     Review of Systems  Constitutional: Negative for fatigue.  Gastrointestinal: Negative for nausea and diarrhea.  Skin: Negative for rash.  Neurological: Negative for dizziness and light-headedness.       Objective:   Physical Exam  Constitutional: He appears well-developed and well-nourished. No distress.  Eyes: No scleral icterus.  Cardiovascular: Normal rate, regular rhythm and normal heart sounds.   No murmur heard. Pulmonary/Chest: Effort normal and breath sounds normal. No respiratory distress.  Skin: No rash noted.    Social History   Social History  . Marital Status: Single    Spouse Name: N/A  . Number of Children: N/A  . Years of Education: N/A   Occupational History  . Not on file.   Social History Main Topics  . Smoking status: Current Every Day Smoker -- 0.25 packs/day    Types: Cigarettes  . Smokeless tobacco: Never Used     Comment: cutting back  . Alcohol Use: 0.0 oz/week    0 Standard drinks or equivalent per week     Comment: states drinks wine and beer everyday; liquor occasional, last dringk 07/31/2015  . Drug Use: Yes    Special: Cocaine     Comment: patient states used crack cocaine 7 days ago, not for several months  . Sexual Activity:    Partners: Female     Comment: given condoms   Other Topics Concern  . Not on file   Social History Narrative        Assessment & Plan:

## 2015-09-16 ENCOUNTER — Ambulatory Visit: Payer: Commercial Managed Care - HMO | Admitting: *Deleted

## 2015-09-16 DIAGNOSIS — F1010 Alcohol abuse, uncomplicated: Secondary | ICD-10-CM

## 2015-09-16 NOTE — BH Specialist Note (Signed)
Client was here for his scheduled appointment with counselor.  Client was oriented times four with good affect and dress.  Client was alert and talkative.  Client communicated that he entered into outpatient substance abuse treatment.  Client denied any substance use and feels more healthy as a result of not drinking.  Client shared that the only thing is dealing with negatively is the gout he has.  Client stated that he feels happy and free from having to feel like he has to drink every day.  Client stated that his life is busy right now between working and preparing for his daughters upcoming wedding. Counselor used reflective listening skills and provide client support and encouragement.  Counselor educated client relapse drift.  Counselor shared with client to establish positive productive mooring lines to anchor himself down so that he does not go adrift.  Client indicated that counselor, his new treatment classes, and his girlfriend were all people who anchor him down and provide the needed support he requires to sustain his sobriety.  Counselor gave client props for making such effort lately.  Client shared that he was proud of himself and his inner circle were noticing positive changes.  Counselor encouraged client to make another appointment for a month out to touch base but to focus on the treatment groups as priority right now.   Jenel LucksJodi Jisel Fleet, MA, LPCA [facility]

## 2015-09-17 DIAGNOSIS — M1711 Unilateral primary osteoarthritis, right knee: Secondary | ICD-10-CM | POA: Diagnosis not present

## 2015-09-23 ENCOUNTER — Telehealth: Payer: Self-pay

## 2015-09-23 NOTE — Telephone Encounter (Signed)
Patient is calling c/o primary care physician, Dr Loleta ChanceHill has requested information from Dr Luciana Axeomer regarding GI referral.  The primary care physician will need to give authorization for referral. Office note faxed to Dr Loleta ChanceHill @ [phone]  . I spoke with Cordelia PenSherry at this office and she will generated the authorization and fax it to our office.  We will need to schedule patient for appointment.   Laurell Josephsammy K Burk Hoctor, RN

## 2015-10-08 ENCOUNTER — Ambulatory Visit (INDEPENDENT_AMBULATORY_CARE_PROVIDER_SITE_OTHER): Payer: Commercial Managed Care - HMO | Admitting: *Deleted

## 2015-10-08 ENCOUNTER — Other Ambulatory Visit: Payer: Commercial Managed Care - HMO

## 2015-10-08 ENCOUNTER — Ambulatory Visit: Payer: Commercial Managed Care - HMO | Admitting: Pharmacist Clinician (PhC)/ Clinical Pharmacy Specialist

## 2015-10-08 DIAGNOSIS — B20 Human immunodeficiency virus [HIV] disease: Secondary | ICD-10-CM

## 2015-10-08 DIAGNOSIS — Z23 Encounter for immunization: Secondary | ICD-10-CM | POA: Diagnosis not present

## 2015-10-08 DIAGNOSIS — B182 Chronic viral hepatitis C: Secondary | ICD-10-CM

## 2015-10-08 LAB — CBC WITH DIFFERENTIAL/PLATELET
BASOS ABS: 0.1 10*3/uL (ref 0.0–0.1)
Basophils Relative: 1 % (ref 0–1)
Eosinophils Absolute: 0.7 10*3/uL (ref 0.0–0.7)
Eosinophils Relative: 9 % — ABNORMAL HIGH (ref 0–5)
HEMATOCRIT: 39.9 % (ref 39.0–52.0)
HEMOGLOBIN: 13.6 g/dL (ref 13.0–17.0)
Lymphocytes Relative: 50 % — ABNORMAL HIGH (ref 12–46)
Lymphs Abs: 4 10*3/uL (ref 0.7–4.0)
MCH: 27.9 pg (ref 26.0–34.0)
MCHC: 34.1 g/dL (ref 30.0–36.0)
MCV: 81.9 fL (ref 78.0–100.0)
MPV: 10.2 fL (ref 8.6–12.4)
Monocytes Absolute: 0.8 10*3/uL (ref 0.1–1.0)
Monocytes Relative: 10 % (ref 3–12)
NEUTROS ABS: 2.4 10*3/uL (ref 1.7–7.7)
Neutrophils Relative %: 30 % — ABNORMAL LOW (ref 43–77)
Platelets: 154 10*3/uL (ref 150–400)
RBC: 4.87 MIL/uL (ref 4.22–5.81)
RDW: 12.6 % (ref 11.5–15.5)
WBC: 7.9 10*3/uL (ref 4.0–10.5)

## 2015-10-08 LAB — COMPLETE METABOLIC PANEL WITH GFR
ALBUMIN: 3.5 g/dL — AB (ref 3.6–5.1)
ALK PHOS: 113 U/L (ref 40–115)
ALT: 36 U/L (ref 9–46)
AST: 38 U/L — ABNORMAL HIGH (ref 10–35)
BUN: 19 mg/dL (ref 7–25)
CALCIUM: 9.4 mg/dL (ref 8.6–10.3)
CO2: 22 mmol/L (ref 20–31)
CREATININE: 1.01 mg/dL (ref 0.70–1.33)
Chloride: 105 mmol/L (ref 98–110)
GFR, EST NON AFRICAN AMERICAN: 83 mL/min (ref >=60)
Glucose, Bld: 102 mg/dL — ABNORMAL HIGH (ref 65–99)
Potassium: 4.4 mmol/L (ref 3.5–5.3)
Sodium: 137 mmol/L (ref 135–146)
TOTAL PROTEIN: 7.8 g/dL (ref 6.1–8.1)
Total Bilirubin: 0.5 mg/dL (ref 0.2–1.2)

## 2015-10-08 NOTE — Progress Notes (Signed)
Patient ID: Lee Johnston, male   DOB: 03/09/1961, 54 y.o.   MRN: [id number]  HPI: Lee Johnston is a 54 y.o. male for hepatitis C follow up.  Lab Results  Component Value Date   HCVGENOTYPE 1a 05/29/2015    Allergies: No Known Allergies  Vitals:    Past Medical History: Past Medical History  Diagnosis Date  . Hypertension     Social History: Social History   Social History  . Marital Status: Single    Spouse Name: N/A  . Number of Children: N/A  . Years of Education: N/A   Social History Main Topics  . Smoking status: Current Every Day Smoker -- 0.25 packs/day    Types: Cigarettes  . Smokeless tobacco: Never Used     Comment: cutting back  . Alcohol Use: 0.0 oz/week    0 Standard drinks or equivalent per week     Comment: states drinks wine and beer everyday; liquor occasional, last dringk 07/31/2015  . Drug Use: Yes    Special: Cocaine     Comment: patient states used crack cocaine 7 days ago, not for several months  . Sexual Activity:    Partners: Female     Comment: given condoms   Other Topics Concern  . Not on file   Social History Narrative    Labs: HEP B S AB (no units)  Date Value  05/29/2015 NEG   HEPATITIS B SURFACE AG (no units)  Date Value  05/29/2015 NEGATIVE   HCV AB (no units)  Date Value  04/26/2010 REACTIVE*    Lab Results  Component Value Date   HCVGENOTYPE 1a 05/29/2015    Hepatitis C RNA quantitative Latest Ref Rng 05/29/2015 11/02/2010  HCV Quantitative <15 IU/mL 3313707(H) 1230000(H)  HCV Quantitative Log <1.18 log 10 6.52(H) -    AST (units/L)  Date Value  11/02/2010 110*  04/26/2010 79*  12/24/2009 89*   ALT (units/L)  Date Value  11/02/2010 126*  04/26/2010 96*  12/24/2009 101*   INR (no units)  Date Value  05/29/2015 1.09    CrCl: CrCl cannot be calculated (Unknown ideal weight.).  Fibrosis Score: F2/F3 as assessed by elastography   Child-Pugh Score: A  Previous Treatment  Regimen: None  Assessment: [name] is a 54 year old male who presents today for hepatitis C follow up. He is on week 4 of Harvoni and has not missed any doses. He has had some fatigue but relates this to his new position at work with increased hours.  [name] has not started any new medications and no drug-drug interactions were identified. Patient was instructed to not start any new medications without calling the clinic first.  Recommendations: - Continue Harvoni 1 tablet daily - Will check Hep C viral load today - Follow up with Dr. Luciana Axeomer after completion of therapy  Ancil Boozeraylor P Willona Phariss, PharmD. PGY-1 Pharmacy Resident Pager: [phone] 10/08/2015, 9:08 AM

## 2015-10-11 LAB — HEPATITIS C RNA QUANTITATIVE: HCV Quantitative: NOT DETECTED IU/mL (ref <15)

## 2015-10-20 ENCOUNTER — Ambulatory Visit: Payer: Commercial Managed Care - HMO | Admitting: *Deleted

## 2015-10-27 ENCOUNTER — Ambulatory Visit: Payer: Commercial Managed Care - HMO | Admitting: *Deleted

## 2015-10-28 ENCOUNTER — Ambulatory Visit: Payer: Commercial Managed Care - HMO | Admitting: *Deleted

## 2015-10-28 DIAGNOSIS — F1010 Alcohol abuse, uncomplicated: Secondary | ICD-10-CM

## 2015-10-28 NOTE — BH Specialist Note (Signed)
Lee Johnston was present for his scheduled appointment today.  Client was oriented times four with good affect and dress.  Client denied any substance use and has been clean for a substantial amount of time now.  Client shared that he has never felt better and appreciated all the counseling provided and initiative of getting him into group treatment.  Client stated that his son's wedding was a little tough getting through watching everyone drink and celebrate but was happy that he was able to resist the temptation.  Counselor educated client on tips for avoiding triggers during holiday to avoid relapse.  Client responded well. Client is continuing the outpatient treatment classes at [facility] and shares that they have really helped him.  Client reported that he has only 6 weeks left of that group before graduating.  Counselor complimented client and provided support accordingly.  Counselor assisted client in making another appointment after the holiday to touch base.  Jenel LucksJodi Selmer Adduci, MA [facility]

## 2015-11-25 ENCOUNTER — Ambulatory Visit (INDEPENDENT_AMBULATORY_CARE_PROVIDER_SITE_OTHER): Payer: Commercial Managed Care - HMO | Admitting: Gastroenterology

## 2015-11-25 ENCOUNTER — Encounter: Payer: Self-pay | Admitting: Gastroenterology

## 2015-11-25 ENCOUNTER — Other Ambulatory Visit (INDEPENDENT_AMBULATORY_CARE_PROVIDER_SITE_OTHER): Payer: Commercial Managed Care - HMO

## 2015-11-25 VITALS — BP 120/90 | HR 60 | Ht 68.0 in | Wt 224.6 lb

## 2015-11-25 DIAGNOSIS — K7460 Unspecified cirrhosis of liver: Secondary | ICD-10-CM | POA: Diagnosis not present

## 2015-11-25 LAB — CBC WITH DIFFERENTIAL/PLATELET
BASOS ABS: 0 10*3/uL (ref 0.0–0.1)
BASOS PCT: 0.6 % (ref 0.0–3.0)
EOS ABS: 0.5 10*3/uL (ref 0.0–0.7)
Eosinophils Relative: 6.8 % — ABNORMAL HIGH (ref 0.0–5.0)
HEMATOCRIT: 40.5 % (ref 39.0–52.0)
Hemoglobin: 13.5 g/dL (ref 13.0–17.0)
LYMPHS ABS: 3.7 10*3/uL (ref 0.7–4.0)
LYMPHS PCT: 47.3 % — AB (ref 12.0–46.0)
MCHC: 33.4 g/dL (ref 30.0–36.0)
MCV: 83.1 fl (ref 78.0–100.0)
Monocytes Absolute: 0.6 10*3/uL (ref 0.1–1.0)
Monocytes Relative: 7.1 % (ref 3.0–12.0)
NEUTROS ABS: 3 10*3/uL (ref 1.4–7.7)
NEUTROS PCT: 38.2 % — AB (ref 43.0–77.0)
PLATELETS: 195 10*3/uL (ref 150.0–400.0)
RBC: 4.87 Mil/uL (ref 4.22–5.81)
RDW: 12.8 % (ref 11.5–15.5)
WBC: 7.9 10*3/uL (ref 4.0–10.5)

## 2015-11-25 LAB — COMPREHENSIVE METABOLIC PANEL
ALT: 33 U/L (ref 0–53)
AST: 32 U/L (ref 0–37)
Albumin: 3.9 g/dL (ref 3.5–5.2)
Alkaline Phosphatase: 100 U/L (ref 39–117)
BUN: 19 mg/dL (ref 6–23)
CALCIUM: 9.9 mg/dL (ref 8.4–10.5)
CHLORIDE: 103 meq/L (ref 96–112)
CO2: 23 meq/L (ref 19–32)
Creatinine, Ser: 1.08 mg/dL (ref 0.40–1.50)
GFR: 90.85 mL/min (ref >60.00)
GLUCOSE: 111 mg/dL — AB (ref 70–99)
Potassium: 3.8 mEq/L (ref 3.5–5.1)
Sodium: 136 mEq/L (ref 135–145)
Total Bilirubin: 0.5 mg/dL (ref 0.2–1.2)
Total Protein: 8.5 g/dL — ABNORMAL HIGH (ref 6.0–8.3)

## 2015-11-25 LAB — PROTIME-INR
INR: 1.2 ratio — ABNORMAL HIGH (ref 0.8–1.0)
PROTHROMBIN TIME: 12.3 s (ref 9.6–13.1)

## 2015-11-25 LAB — FERRITIN: Ferritin: 212.7 ng/mL (ref 22.0–322.0)

## 2015-11-25 LAB — IBC PANEL
Iron: 114 ug/dL (ref 42–165)
SATURATION RATIOS: 26.4 % (ref 20.0–50.0)
TRANSFERRIN: 308 mg/dL (ref 212.0–360.0)

## 2015-11-25 NOTE — Patient Instructions (Addendum)
You will get labs drawn today: total iron, ferritin, TIBC,AMA,anti smooth muscle antibody, CBC, CMET, INR. It is important that you have a relatively low salt diet.  High salt diet can cause fluid to accumulate in your legs, abdomen and even around your lungs. You should try to avoid NSAID type over the counter pain medicines as best as possible. Tylenol is safe to take for 'routine' aches and pains, but never take more than 1/2 the dose suggested on the package instructions (never more than 2 grams per day). Avoid alcohol. You will be set up for an upper endoscopy to screen for varices (LE[facility]). Please return to see Dr. Christella HartiganJacobs in 3-4 months.

## 2015-11-25 NOTE — Progress Notes (Signed)
HPI: This is a    very pleasant 55 year old man   who was referred to me by Gardiner Barefoot, MD  to evaluate  cirrhosis .    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  Previous alcoholic; last drink sept 9th 4782,  Was heavy drinking for many years. Alcohol including wine, beer, liquor. Had been drinking since his late teens  Completed harvoni last night. 12 week course.  Did well throughout this course  Has never had ascites, trouble with lower extremity edema, no overt GI bleeding. He tells me he had a colonoscopy for 5 years ago and that was normal. He does not recall exactly where this was done.    [place] 08/2015:Cholelithiasis. No evidence of acute cholecystitis or biliary ductaldilatation.Probable hepatic cirrhosis. No liver mass visualized.Median hepatic shear wave velocity is calculated at 2.88 m/sec.Metavir fibrosis score is some F3 +F4. Risk of fibrosis is high. Labs 05/2015: ANA neg, HIV neg, Hep A Ab total neg, Hep B S Ag neg, Hep B S Ab neg, Hep C + (genotype 1a)    Review of systems: Pertinent positive and negative review of systems were noted in the above HPI section. Complete review of systems was performed and was otherwise normal.   Past Medical History  Diagnosis Date  . Hypertension   . Alcohol abuse   . Hepatitis   . Cirrhosis (HCC)     History reviewed. No pertinent past surgical history.  Current Outpatient Prescriptions  Medication Sig Dispense Refill  . allopurinol (ZYLOPRIM) 300 MG tablet Take 300 mg by mouth daily as needed (for gout).    Marland Kitchen amLODipine (NORVASC) 10 MG tablet Take 10 mg by mouth daily.    Marland Kitchen atenolol (TENORMIN) 25 MG tablet Take by mouth daily.    . diphenhydrAMINE (BENADRYL) 25 mg capsule Take 25 mg by mouth at bedtime as needed for sleep.    . naproxen sodium (ANAPROX) 220 MG tablet Take 220 mg by mouth daily as needed (for pain).    . Vitamin D,  Ergocalciferol, (DRISDOL) 50000 UNITS CAPS capsule Take 50,000 Units by mouth every 7 (seven) days. On wednesdays    . lisinopril (PRINIVIL,ZESTRIL) 40 MG tablet      No current facility-administered medications for this visit.    Allergies as of 11/25/2015  . (No Known Allergies)    History reviewed. No pertinent family history.  Social History   Social History  . Marital Status: Single    Spouse Name: N/A  . Number of Children: 2  . Years of Education: N/A   Occupational History  . [employer]   Social History Main Topics  . Smoking status: Former Smoker -- 0.25 packs/day    Types: Cigarettes  . Smokeless tobacco: Never Used     Comment: cutting back  . Alcohol Use: 0.0 oz/week    0 Standard drinks or equivalent per week     Comment: states drinks wine and beer everyday; liquor occasional, last dringk 07/31/2015  . Drug Use: Yes    Special: Cocaine     Comment: patient states used crack cocaine 7 days ago, not for several months  . Sexual Activity:    Partners: Female     Comment: given condoms   Other Topics Concern  . Not on file   Social History Narrative     Physical Exam: BP 120/90 mmHg  Pulse 60  Ht 5'  8" (1.727 m)  Wt 224 lb 9.6 oz (101.878 kg)  BMI 34.16 kg/m2 Constitutional: generally well-appearing Psychiatric: alert and oriented x3 Eyes: extraocular movements intact Mouth: oral pharynx moist, no lesions Neck: supple no lymphadenopathy Cardiovascular: heart regular rate and rhythm Lungs: clear to auscultation bilaterally Abdomen: soft, nontender, nondistended, no obvious ascites, no peritoneal signs, normal bowel sounds Extremities: no lower extremity edema bilaterally Skin: no lesions on visible extremities   Assessment and plan: 55 y.o. male with  well compensated hepatitis C, alcohol related cirrhosis  He completed hepatitis C treatment last night and hopefully he will have complete eradication. He is due to meet with  Dr. Dalbert Mayottecomber again in several weeks. He stopped drinking about 3-4 months ago and has no intention of restarting. I commended him on this. He seems to have no sign of decompensation by history or physical exam or laboratory parameters. He will get a repeat set of tests today including a CBC, complete metabolic profile, coags as well as some other tests for causes of chronic liver disease to be sure were not dealing with anything else other than the above mentioned issues. He had colon cancer screening with colonoscopy for 5 years ago tells me it was normal. He needs screening with EGD for esophageal varices and we will set that up for him at his soonest convenience. He will also return to see me in 3-4 months in the office.   Rob Buntinganiel Koby Hartfield, MD  Gastroenterology 11/25/2015, 8:25 AM  Cc: Gardiner Barefootomer, Robert W, MD

## 2015-11-27 LAB — MITOCHONDRIAL ANTIBODIES: MITOCHONDRIAL M2 AB, IGG: 20.2 U — AB (ref <=20.0)

## 2015-11-27 LAB — ANTI-SMOOTH MUSCLE ANTIBODY, IGG: Smooth Muscle Ab: 13 U (ref <20)

## 2015-12-02 ENCOUNTER — Ambulatory Visit: Payer: Commercial Managed Care - HMO | Admitting: *Deleted

## 2015-12-02 DIAGNOSIS — F1010 Alcohol abuse, uncomplicated: Secondary | ICD-10-CM

## 2015-12-02 NOTE — BH Specialist Note (Signed)
Roger ShelterGordon was present for his scheduled appointment today.  Client was oriented times four with good affect and dress.  Client was alert and talkative.  Client shared that he was doing well and had continued to not drink.  Client shared that he has stopped smoking tobacco as of the last session together.  Client indicated that he scared himself on Christmas eve because he was for some reason triggered and had to fight against the tempation to use. Client stated that he felt like he needed a support group of some sort to help with his aftercare.  Counselor assisted client with listing some support groups as well as outpatient substance abuse treatment.  Client agreed to look into what fits his schedule best.  Client shared that he would like to see counselor at least one more time in about a month just to check and have continued accountability.   Jod Decarlos Empey, MA, LPCA [facility]

## 2015-12-03 NOTE — BH Specialist Note (Signed)
Araceli Bouche met with counselor today to check in.  Client was oriented times four with good affect and dress.  Client was alert and talkative. Client shared that he had completed his medication regimen for the Hep C. and was communicated to by his doctor that the Hep C. was gone.  Client indicated that he still wanted to live a sober free life and was going to start focusing on improving his life in other ares such as going back to school.  Counselor applauded client and encouraged him to keep up the good work and stay on the right path in order to remain healthy.  Counselor provided support and gave client an open invitation to return for counseling services if there need arose.    Rolena Infante, MA, LPCA [facility]

## 2015-12-16 DIAGNOSIS — M10041 Idiopathic gout, right hand: Secondary | ICD-10-CM | POA: Diagnosis not present

## 2015-12-16 DIAGNOSIS — M109 Gout, unspecified: Secondary | ICD-10-CM | POA: Diagnosis not present

## 2015-12-23 DIAGNOSIS — M65341 Trigger finger, right ring finger: Secondary | ICD-10-CM | POA: Diagnosis not present

## 2015-12-23 DIAGNOSIS — F5221 Male erectile disorder: Secondary | ICD-10-CM | POA: Diagnosis not present

## 2015-12-24 ENCOUNTER — Ambulatory Visit: Payer: Commercial Managed Care - HMO | Admitting: Internal Medicine

## 2015-12-31 ENCOUNTER — Encounter (HOSPITAL_COMMUNITY): Payer: Self-pay

## 2015-12-31 ENCOUNTER — Emergency Department (INDEPENDENT_AMBULATORY_CARE_PROVIDER_SITE_OTHER)
Admission: EM | Admit: 2015-12-31 | Discharge: 2015-12-31 | Disposition: A | Discharge status: Home / Self Care | Payer: Commercial Managed Care - HMO | Source: Home / Self Care | Attending: Family Medicine | Admitting: Family Medicine

## 2015-12-31 DIAGNOSIS — R69 Illness, unspecified: Principal | ICD-10-CM

## 2015-12-31 DIAGNOSIS — J111 Influenza due to unidentified influenza virus with other respiratory manifestations: Secondary | ICD-10-CM | POA: Diagnosis not present

## 2015-12-31 HISTORY — DX: Gout, unspecified: M10.9

## 2015-12-31 HISTORY — DX: Essential (primary) hypertension: I10

## 2015-12-31 MED ORDER — BENZONATATE 100 MG PO CAPS: 100.0000 mg | ORAL_CAPSULE | Freq: Three times a day (TID) | ORAL | Status: DC | PRN

## 2015-12-31 MED ORDER — GUAIFENESIN ER 600 MG PO TB12: 600.0000 mg | ORAL_TABLET | Freq: Two times a day (BID) | ORAL | Status: DC | PRN

## 2015-12-31 NOTE — ED Provider Notes (Signed)
CSN: [id number]     Arrival date & time 12/31/15  1436 History   First MD Initiated Contact with Patient 12/31/15 1642     Chief Complaint  Patient presents with  . Generalized Body Aches  . Cough   (Consider location/radiation/quality/duration/timing/severity/associated sxs/prior Treatment) Patient is a 55 y.o. male presenting with cough. The history is provided by the patient. No language interpreter was used.  Cough Associated symptoms: chills, diaphoresis, fever and rhinorrhea   Associated symptoms: no chest pain and no ear pain   Patient complains of body aches and cough, chills and sweats since Monday Feb 6th.  Started in the morning, had to come home from work has not been back since. Works at [employer].  Watery rhinorrhea, non-productive cough.  Headache with cough.  Body aches, chest pain with coughing.    PMHx history alcoholic liver disease and HCV, treated for HCV.    Social Hx; Smokes 1-3 cigarettes a MONTH. Lives alone.   Past Medical History  Diagnosis Date  . Hypertension   . Alcohol abuse   . Hepatitis   . Cirrhosis (HCC)   . Gout   . HBP (high blood pressure)    History reviewed. No pertinent past surgical history. History reviewed. No pertinent family history. Social History  Substance Use Topics  . Smoking status: Former Smoker -- 0.25 packs/day    Types: Cigarettes  . Smokeless tobacco: Never Used     Comment: cutting back  . Alcohol Use: 0.0 oz/week    0 Standard drinks or equivalent per week     Comment: states drinks wine and beer everyday; liquor occasional, last dringk 07/31/2015    Review of Systems  Constitutional: Positive for fever, chills, diaphoresis and appetite change.  HENT: Positive for rhinorrhea. Negative for congestion and ear pain.   Respiratory: Positive for cough. Negative for chest tightness.   Cardiovascular: Negative for chest pain.    Allergies  Review of patient's allergies indicates no known allergies.  Home  Medications   Prior to Admission medications   Medication Sig Start Date End Date Taking? Authorizing Provider  allopurinol (ZYLOPRIM) 300 MG tablet Take 300 mg by mouth daily as needed (for gout).    Historical Provider, MD  amLODipine (NORVASC) 10 MG tablet Take 10 mg by mouth daily.    Historical Provider, MD  atenolol (TENORMIN) 25 MG tablet Take by mouth daily.    Historical Provider, MD  benzonatate (TESSALON PERLES) 100 MG capsule Take 1 capsule (100 mg total) by mouth 3 (three) times daily as needed for cough. 12/31/15   Barbaraann Barthel, MD  diphenhydrAMINE (BENADRYL) 25 mg capsule Take 25 mg by mouth at bedtime as needed for sleep.    Historical Provider, MD  guaiFENesin (MUCINEX) 600 MG 12 hr tablet Take 1 tablet (600 mg total) by mouth 2 (two) times daily as needed. 12/31/15   Barbaraann Barthel, MD  lisinopril (PRINIVIL,ZESTRIL) 40 MG tablet  10/31/15   Historical Provider, MD  naproxen sodium (ANAPROX) 220 MG tablet Take 220 mg by mouth daily as needed (for pain).    Historical Provider, MD  Vitamin D, Ergocalciferol, (DRISDOL) 50000 UNITS CAPS capsule Take 50,000 Units by mouth every 7 (seven) days. On wednesdays    Historical Provider, MD   Meds Ordered and Administered this Visit  Medications - No data to display  BP 140/96 mmHg  Pulse 59  Temp(Src) 97.7 F (36.5 C) (Oral)  Resp 16  SpO2 100% No data found.  Physical Exam  Constitutional: He appears well-developed and well-nourished. No distress.  Speaking fluidly. No apparent distress  HENT:  Head: Normocephalic and atraumatic.  Right Ear: External ear normal.  Left Ear: External ear normal.  Mouth/Throat: Oropharynx is clear and moist. No oropharyngeal exudate.  No sinus tenderenss maxillary or frontal sinuses  Eyes: Conjunctivae and EOM are normal. Pupils are equal, round, and reactive to light. Right eye exhibits no discharge. Left eye exhibits no discharge.  Neck: Normal range of motion. Neck supple. No tracheal  deviation present. No thyromegaly present.  Cardiovascular: Normal rate, regular rhythm and normal heart sounds.  Exam reveals no gallop and no friction rub.   No murmur heard. Pulmonary/Chest: Effort normal and breath sounds normal. No respiratory distress. He has no wheezes. He has no rales. He exhibits no tenderness.  Abdominal: Soft.  Lymphadenopathy:    He has no cervical adenopathy.  Skin: He is not diaphoretic.    ED Course  Procedures (including critical care time)  Labs Review Labs Reviewed - No data to display  Imaging Review No results found.   Visual Acuity Review  Right Eye Distance:   Left Eye Distance:   Bilateral Distance:    Right Eye Near:   Left Eye Near:    Bilateral Near:         MDM   1. Influenza-like illness    ILI, no findings to support PNA or other post-flu illness. Supportive care, note for work.  Asked to follow up with PCP for further evaluation, notes for work.   Paula Compton, MD    Barbaraann Barthel, MD 12/31/15 [phone]

## 2015-12-31 NOTE — Discharge Instructions (Signed)
It is a pleasure to see you today. I believe you have the flu.   For the aches and pains, Tylenol  tablets, take 1/2 tablet by mouth every 6 hours as needed for aches and pains.  For the cough, Tessalon perles , take 1 by mouth every 8 hours as needed for cough.   Mucinex  tablets, take 1 tablet by mouth every 12 hours.  Follow up with Dr Loleta Chance in the coming week.

## 2015-12-31 NOTE — ED Notes (Signed)
Pt stated that he has had body aches and cough since Monday and OTC medications are not helping Pt has had no fever Pt alert and oriented

## 2016-01-04 ENCOUNTER — Ambulatory Visit (AMBULATORY_SURGERY_CENTER): Payer: Commercial Managed Care - HMO | Admitting: Gastroenterology

## 2016-01-04 ENCOUNTER — Encounter: Payer: Self-pay | Admitting: Gastroenterology

## 2016-01-04 VITALS — BP 145/92 | HR 58 | Temp 97.4°F | Resp 20 | Ht 68.0 in | Wt 224.0 lb

## 2016-01-04 DIAGNOSIS — Z13810 Encounter for screening for upper gastrointestinal disorder: Secondary | ICD-10-CM

## 2016-01-04 DIAGNOSIS — I10 Essential (primary) hypertension: Secondary | ICD-10-CM | POA: Diagnosis not present

## 2016-01-04 DIAGNOSIS — K7460 Unspecified cirrhosis of liver: Secondary | ICD-10-CM | POA: Diagnosis present

## 2016-01-04 MED ORDER — SODIUM CHLORIDE 0.9 % IV SOLN: 500.0000 mL | INTRAVENOUS | Status: DC

## 2016-01-04 NOTE — Op Note (Signed)
[facility] [address] [place], [address]   ENDOSCOPY PROCEDURE REPORT  PATIENT: Lee Johnston, Lee Johnston  MR#: [id number] BIRTHDATE: 1961/09/01 , 54  yrs. old GENDER: male ENDOSCOPIST: Rachael Fee, MD PROCEDURE DATE:  01/04/2016 PROCEDURE:  EGD, diagnostic ASA CLASS:     Class III INDICATIONS:  cirrhosis, screening for varices. MEDICATIONS: Monitored anesthesia care and Propofol 200 mg IV TOPICAL ANESTHETIC: none  DESCRIPTION OF PROCEDURE: After the risks benefits and alternatives of the procedure were thoroughly explained, informed consent was obtained.  The LB [id number] endoscope was introduced through the mouth and advanced to the second portion of the duodenum , Without limitations.  The instrument was slowly withdrawn as the mucosa was fully examined.  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  The examination was otherwise normal.  No varices in esophagus or stomach.  Retroflexed views revealed no abnormalities.     The scope was then withdrawn from the patient and the procedure completed. COMPLICATIONS: There were no immediate complications.  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  The examination was otherwise normal.  No varices in esophagus or stomach  RECOMMENDATIONS: My office will contact you about follow up visit for 6-8 weeks from now.   eSigned:  Rachael Fee, MD 01/04/2016 3:51 PM

## 2016-01-04 NOTE — Patient Instructions (Signed)
YOU HAD AN ENDOSCOPIC PROCEDURE TODAY AT THE [facility]:   Refer to the procedure report that was given to you for any specific questions about what was found during the examination.  If the procedure report does not answer your questions, please call your gastroenterologist to clarify.  If you requested that your care partner not be given the details of your procedure findings, then the procedure report has been included in a sealed envelope for you to review at your convenience later.  YOU SHOULD EXPECT: Some feelings of bloating in the abdomen. Passage of more gas than usual.  Walking can help get rid of the air that was put into your GI tract during the procedure and reduce the bloating.   Please Note:  You might notice some irritation and congestion in your nose or some drainage.  This is from the oxygen used during your procedure.  There is no need for concern and it should clear up in a day or so.  SYMPTOMS TO REPORT IMMEDIATELY:    Following upper endoscopy (EGD)  Vomiting of blood or coffee ground material  New chest pain or pain under the shoulder blades  Painful or persistently difficult swallowing  New shortness of breath  Fever of 100F or higher  Black, tarry-looking stools  For urgent or emergent issues, a gastroenterologist can be reached at any hour by calling [phone].   DIET: Your first meal following the procedure should be a small meal and then it is ok to progress to your normal diet. Heavy or fried foods are harder to digest and may make you feel nauseous or bloated.  Likewise, meals heavy in dairy and vegetables can increase bloating.  Drink plenty of fluids but you should avoid alcoholic beverages for 24 hours.  ACTIVITY:  You should plan to take it easy for the rest of today and you should NOT DRIVE or use heavy machinery until tomorrow (because of the sedation medicines used during the test).    FOLLOW UP: Our staff will call the number listed  on your records the next business day following your procedure to check on you and address any questions or concerns that you may have regarding the information given to you following your procedure. If we do not reach you, we will leave a message.  However, if you are feeling well and you are not experiencing any problems, there is no need to return our call.  We will assume that you have returned to your regular daily activities without incident.  If any biopsies were taken you will be contacted by phone or by letter within the next 1-3 weeks.  Please call us at [phone] if you have not heard about the biopsies in 3 weeks.    SIGNATURES/CONFIDENTIALITY: You and/or your care partner have signed paperwork which will be entered into your electronic medical record.  These signatures attest to the fact that that the information above on your After Visit Summary has been reviewed and is understood.  Full responsibility of the confidentiality of this discharge information lies with you and/or your care-partner.  Try to stop drinking alcohol. It is ruining your liver.  Thank-you for choosing [place] for your medical needs today.

## 2016-01-04 NOTE — Progress Notes (Signed)
To Pacu  Awake and alert Report to RN 

## 2016-01-05 ENCOUNTER — Telehealth: Payer: Self-pay

## 2016-01-05 DIAGNOSIS — M79672 Pain in left foot: Secondary | ICD-10-CM | POA: Diagnosis not present

## 2016-01-05 NOTE — Telephone Encounter (Signed)
  Follow up Call-  Call back number 01/04/2016  Post procedure Call Back phone  # [phone]  Permission to leave phone message Yes     Patient questions:  Do you have a fever, pain , or abdominal swelling? No. Pain Score  0 *  Have you tolerated food without any problems? Yes.    Have you been able to return to your normal activities? Yes.    Do you have any questions about your discharge instructions: Diet   No. Medications  No. Follow up visit  No.  Do you have questions or concerns about your Care? No.  Actions: * If pain score is 4 or above: No action needed, pain <4.

## 2016-02-25 ENCOUNTER — Ambulatory Visit (INDEPENDENT_AMBULATORY_CARE_PROVIDER_SITE_OTHER): Payer: Commercial Managed Care - HMO | Admitting: Internal Medicine

## 2016-02-25 ENCOUNTER — Encounter: Payer: Self-pay | Admitting: Internal Medicine

## 2016-02-25 VITALS — BP 150/100 | HR 63 | Temp 98.0°F | Ht 67.0 in | Wt 217.0 lb

## 2016-02-25 DIAGNOSIS — K7460 Unspecified cirrhosis of liver: Secondary | ICD-10-CM | POA: Diagnosis not present

## 2016-02-25 DIAGNOSIS — B182 Chronic viral hepatitis C: Secondary | ICD-10-CM

## 2016-02-25 NOTE — Progress Notes (Signed)
   Subjective:    Patient ID: Lee Johnston, male    DOB: 04/07/1961, 55 y.o.   MRN: [id number]  HPI Here for follow up of hepatitis C.  Has genotype 1a, finished Harvoni about 10 weeks ago and no issues.  Also saw Dr. Christella HartiganJacobs and had EGD, reported no issues.  Feels well, continues off of alcohol.  F3/4 on elastography.     Review of Systems  Constitutional: Negative for fatigue.  Gastrointestinal: Negative for diarrhea.  Neurological: Negative for dizziness and headaches.       Objective:   Physical Exam  Constitutional: He appears well-developed and well-nourished. No distress.  Eyes: No scleral icterus.  Cardiovascular: Normal rate, regular rhythm and normal heart sounds.   No murmur heard. Skin: No rash noted.          Assessment & Plan:

## 2016-02-25 NOTE — Assessment & Plan Note (Signed)
[facility], which he will need every 6 months.

## 2016-02-25 NOTE — Assessment & Plan Note (Signed)
Will check viral load today.  Though a little early, will repeat in about 2 months to assure it remains undetectable and then would consider it cured.

## 2016-02-26 LAB — HEPATITIS C RNA QUANTITATIVE: HCV QUANT: NOT DETECTED [IU]/mL (ref <15)

## 2016-03-15 ENCOUNTER — Ambulatory Visit: Payer: Commercial Managed Care - HMO | Admitting: Gastroenterology

## 2016-04-19 ENCOUNTER — Ambulatory Visit (INDEPENDENT_AMBULATORY_CARE_PROVIDER_SITE_OTHER): Payer: Commercial Managed Care - HMO | Admitting: Internal Medicine

## 2016-04-19 ENCOUNTER — Encounter: Payer: Self-pay | Admitting: Internal Medicine

## 2016-04-19 VITALS — BP 121/79 | HR 73 | Temp 98.0°F | Wt 219.0 lb

## 2016-04-19 DIAGNOSIS — B182 Chronic viral hepatitis C: Secondary | ICD-10-CM

## 2016-04-19 DIAGNOSIS — K7460 Unspecified cirrhosis of liver: Secondary | ICD-10-CM

## 2016-04-19 NOTE — Assessment & Plan Note (Signed)
[facility] schedule [facility] now and every 6 months.

## 2016-04-19 NOTE — Progress Notes (Signed)
   Subjective:    Patient ID: Lee Johnston, male    DOB: 06/08/1961, 55 y.o.   MRN: [id number]  HPI Here for follow up of hepatitis C.  Has genotype 1a, finished Harvoni 3 weeks ago and no issues.  Also saw Dr. Christella HartiganJacobs and had EGD, reported no issues.  Feels well, continues off of alcohol.  F3/4 on elastography.     Review of Systems  Constitutional: Negative for fatigue.  Gastrointestinal: Negative for diarrhea.  Neurological: Negative for dizziness and headaches.       Objective:   Physical Exam  Constitutional: He appears well-developed and well-nourished. No distress.  Eyes: No scleral icterus.  Cardiovascular: Normal rate, regular rhythm and normal heart sounds.   No murmur heard. Skin: No rash noted.          Assessment & Plan:

## 2016-04-19 NOTE — Assessment & Plan Note (Signed)
Test of cure today 

## 2016-04-20 LAB — HEPATITIS C RNA QUANTITATIVE: HCV Quantitative: NOT DETECTED IU/mL (ref <15)

## 2016-05-09 IMAGING — US US ABDOMEN COMPLETE W/ ELASTOGRAPHY
1 series · 13 of 25 positions shown · non-contrast
Comparison: None.

CLINICAL DATA: Chronic hepatitis-C without hepatic coma.



[Series 1: us abdomen complete w/ elastography · 0.20mm/px · 13 of 72 slices shown]
[im 1/72]
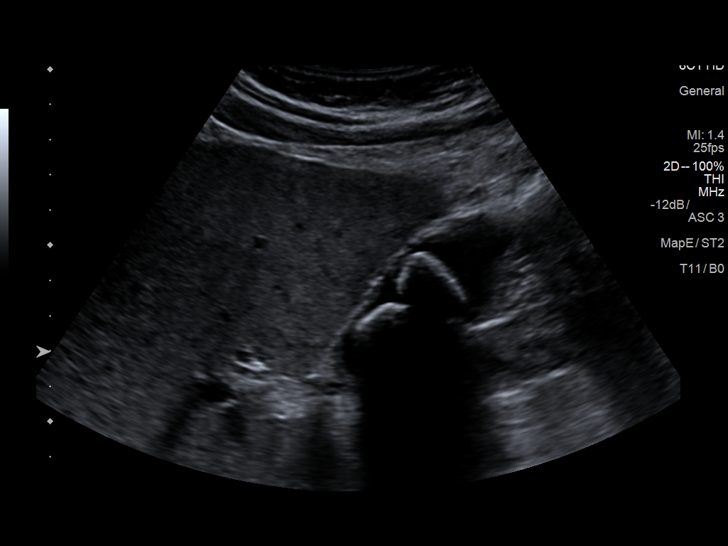
[im 6/72]
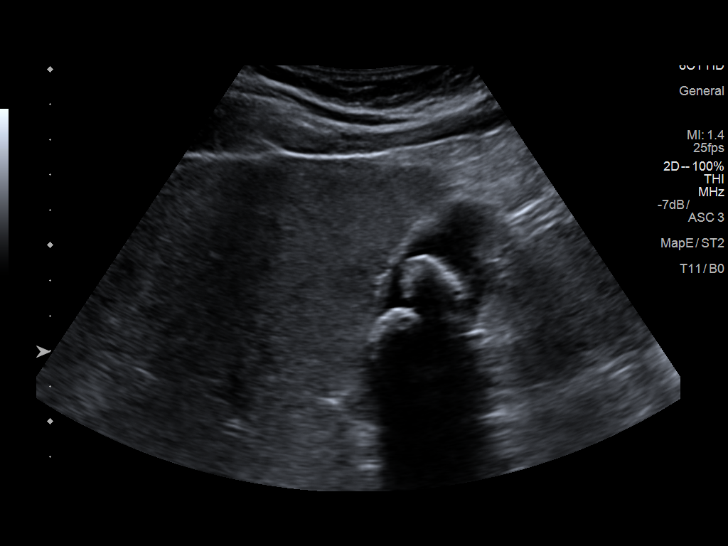
[im 12/72]
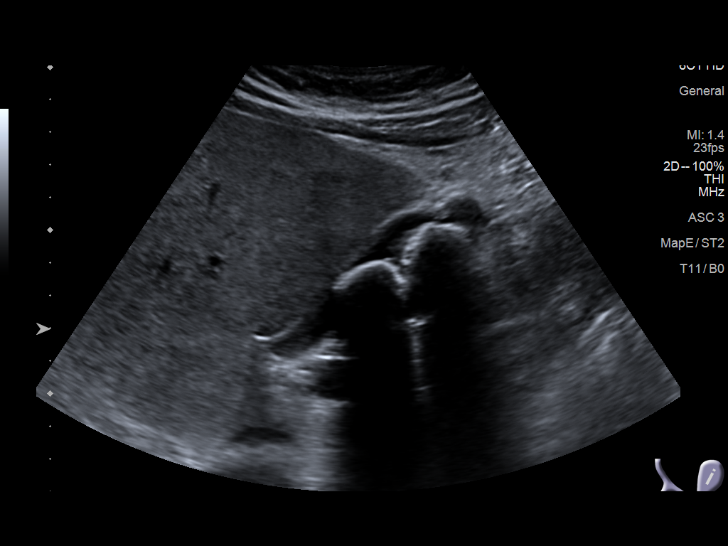
[im 18/72]
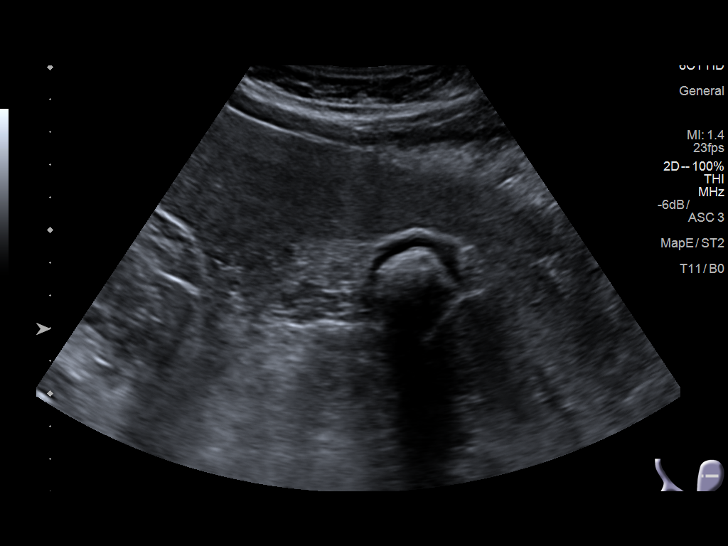
[im 24/72]
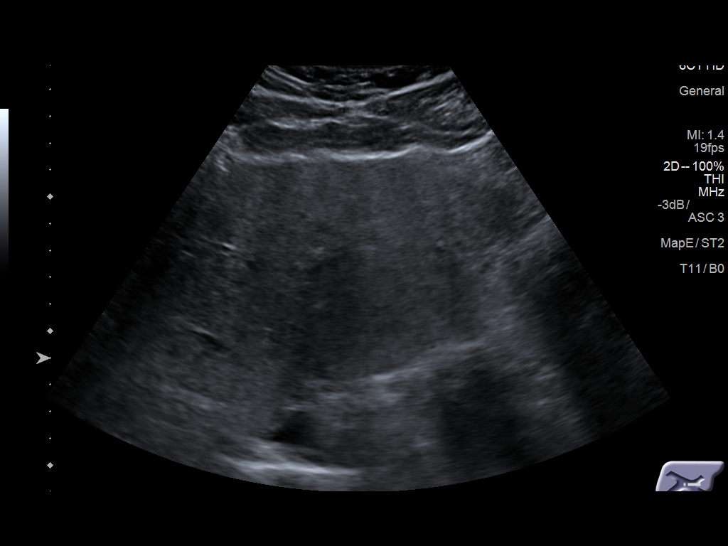
[im 30/72]
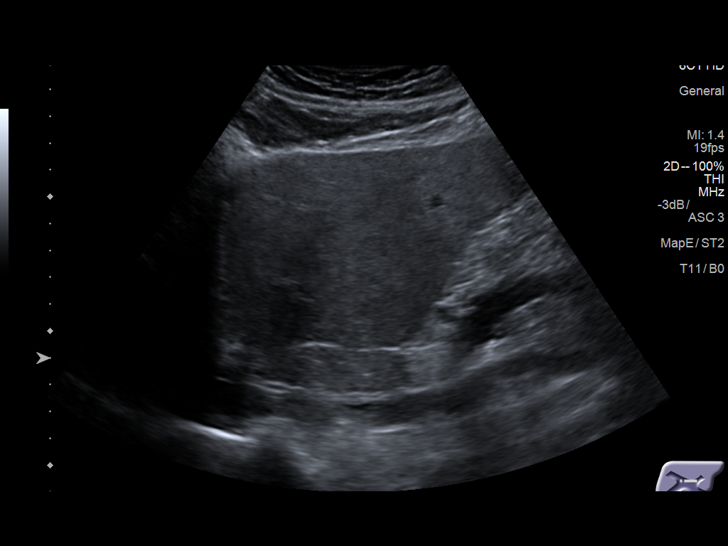
[im 36/72]
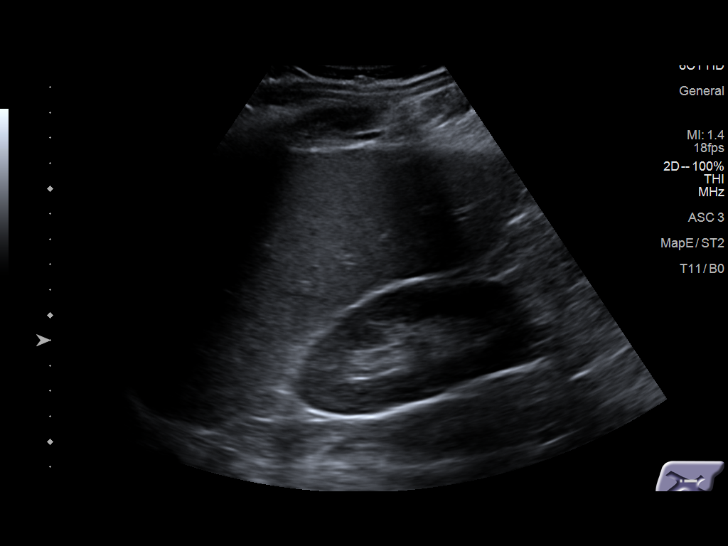
[im 42/72]
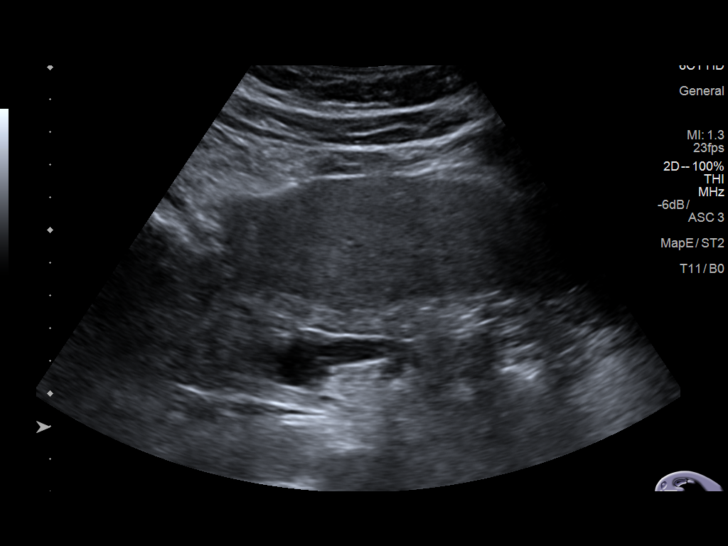
[im 48/72]
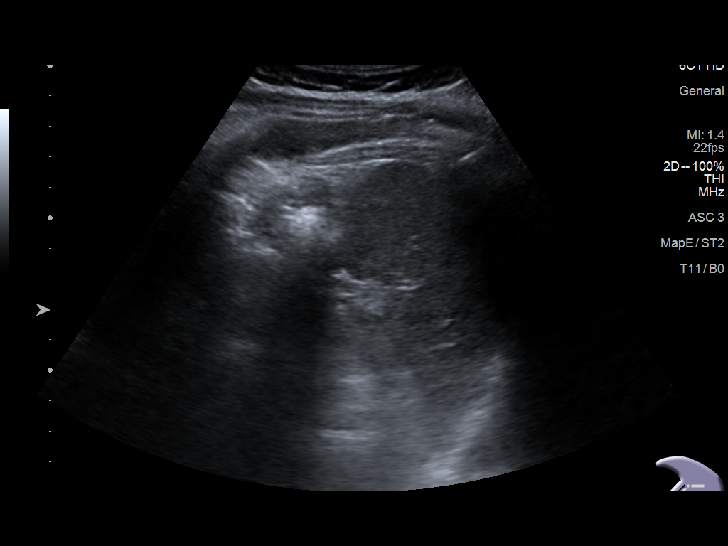
[im 54/72]
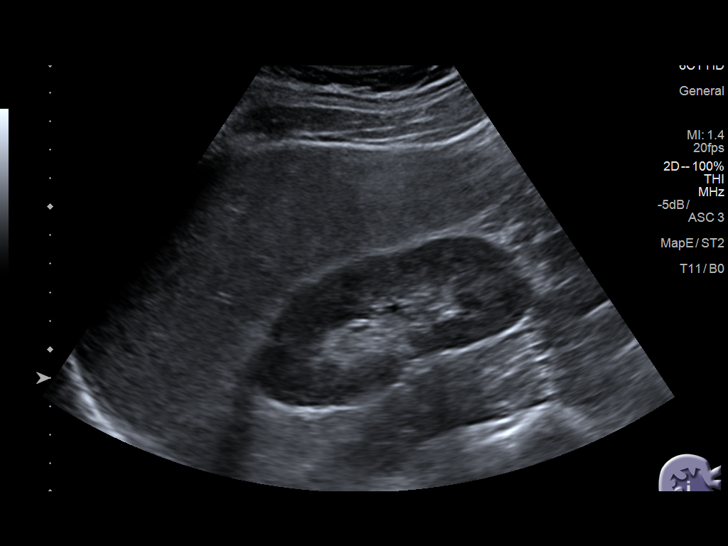
[im 60/72]
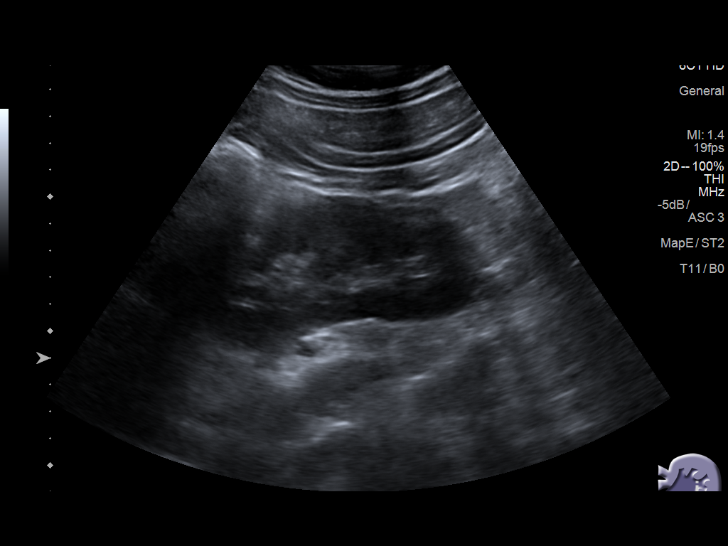
[im 66/72]
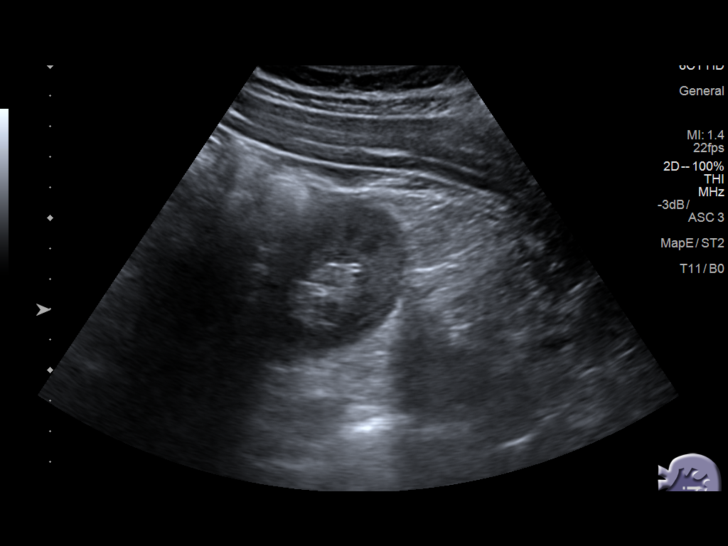
[im 72/72]
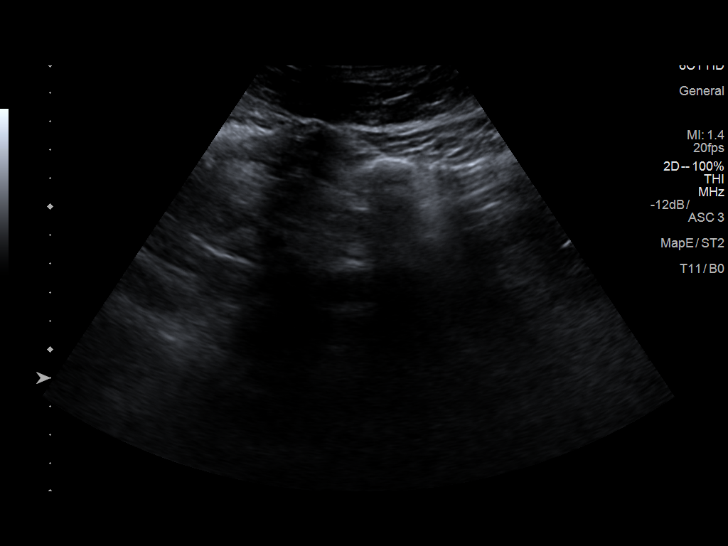

[13 of 25 positions shown; findings below may reference images not displayed]

FINDINGS: ULTRASOUND ABDOMEN

Gallbladder: Multiple gallstones are seen, largest measuring 2.6 cm.
No evidence of gallbladder wall thickening. No sonographic Murphy
sign noted by sonographer.

Common bile duct: Diameter: 6 mm, within normal limits.

Liver: No liver mass visualized. Mild coarsening of hepatic
echotexture and probable mild capsular nodularity, suspicious for
hepatic cirrhosis.

IVC: No abnormality visualized.

Pancreas: Visualized portion unremarkable.

Spleen: Size and appearance within normal limits.

Right Kidney: Length: 10.6 cm. Echogenicity within normal limits. No
mass or hydronephrosis visualized.

Left Kidney: Length: 10.9 cm. Echogenicity within normal limits. No
mass or hydronephrosis visualized.

Abdominal aorta: No aneurysm visualized.

Other findings: None.

ULTRASOUND HEPATIC ELASTOGRAPHY

Device: Siemens Helix VTQ

Transducer 6C 1

Patient position: Left lateral decubitus

Number of measurements:  10

Hepatic Segment:  8

Median velocity:   2.88  m/sec

IQR:

IQR/Median velocity ratio

Corresponding Metavir fibrosis score:  Some F3 +F4

Risk of fibrosis: High

Limitations of exam: None

Pertinent findings noted on other imaging exams:  None

Please note that abnormal shear wave velocities may also be
identified in clinical settings other than with hepatic fibrosis,
such as: acute hepatitis, elevated right heart and central venous
pressures including use of beta blockers, Soza disease
(Danelle), infiltrative processes such as
mastocytosis/amyloidosis/infiltrative tumor, extrahepatic
cholestasis, in the post-prandial state, and liver transplantation.
Correlation with patient history, laboratory data, and clinical
condition recommended.
IMPRESSION: Cholelithiasis. No evidence of acute cholecystitis or biliary ductal
dilatation.

Probable hepatic cirrhosis.  No liver mass visualized.

Median hepatic shear wave velocity is calculated at 2.88 m/sec.

Corresponding Metavir fibrosis score is some F3 +F4.

Risk of fibrosis is high.

Follow-up:  Followup advised

## 2016-05-18 ENCOUNTER — Telehealth: Payer: Self-pay

## 2016-05-18 NOTE — Telephone Encounter (Signed)
Called to follow-up if patient received elast[facility] at [facility]. [facility] patient's primary care provider, Dr. Barnetta ChapelWhelan, but was told at both practices that provider is no longer working there. Called patient to see if he has a [facility] primary [facility]. Left message to return call to clinic. Will continue to follow. Rejeana Brockandace Murray, LPN

## 2016-05-20 ENCOUNTER — Telehealth: Payer: Self-pay

## 2016-05-20 NOTE — Telephone Encounter (Signed)
Patient called back to triage this mornig to tell LPN that he has a new provider at the [facility] [place]. Patient stated his current provider is Dr.Wedda, but that he was at work and could not remember her fax number. Patient said he will come in on Monday, July 3rd and bring his new provider information. Rejeana Brockandace Audi Wettstein, LPN

## 2016-06-14 ENCOUNTER — Telehealth: Payer: Self-pay

## 2016-06-14 NOTE — Telephone Encounter (Signed)
Elastography referral sent to Dr. Ferdie Ping office at number supplied by patient. Faxed to [phone]. Fax returned OK status. Rejeana Brock, LPN

## 2016-07-04 ENCOUNTER — Telehealth: Payer: Self-pay

## 2016-07-04 NOTE — Telephone Encounter (Signed)
Called to follow up with patient to see if he has been scheduled for an elastography at the [facility] yet. No answer. No voicemail. Rejeana Brockandace Murray, LPN

## 2016-10-20 ENCOUNTER — Ambulatory Visit: Payer: Commercial Managed Care - HMO | Admitting: Internal Medicine

## 2021-07-22 ENCOUNTER — Emergency Department (HOSPITAL_COMMUNITY)
Admission: EM | Admit: 2021-07-22 | Discharge: 2021-07-22 | Disposition: A | Discharge status: Home / Self Care | Payer: Medicare HMO | Attending: Emergency Medicine | Admitting: Emergency Medicine

## 2021-07-22 ENCOUNTER — Encounter (HOSPITAL_COMMUNITY): Payer: Self-pay | Admitting: Emergency Medicine

## 2021-07-22 ENCOUNTER — Other Ambulatory Visit: Payer: Self-pay

## 2021-07-22 ENCOUNTER — Emergency Department (HOSPITAL_COMMUNITY): Payer: Medicare HMO

## 2021-07-22 DIAGNOSIS — Z79899 Other long term (current) drug therapy: Secondary | ICD-10-CM | POA: Diagnosis not present

## 2021-07-22 DIAGNOSIS — R110 Nausea: Secondary | ICD-10-CM | POA: Insufficient documentation

## 2021-07-22 DIAGNOSIS — R1030 Lower abdominal pain, unspecified: Secondary | ICD-10-CM | POA: Diagnosis not present

## 2021-07-22 DIAGNOSIS — Z87891 Personal history of nicotine dependence: Secondary | ICD-10-CM | POA: Insufficient documentation

## 2021-07-22 DIAGNOSIS — R102 Pelvic and perineal pain: Secondary | ICD-10-CM | POA: Diagnosis not present

## 2021-07-22 DIAGNOSIS — R609 Edema, unspecified: Secondary | ICD-10-CM | POA: Diagnosis not present

## 2021-07-22 DIAGNOSIS — R1084 Generalized abdominal pain: Secondary | ICD-10-CM | POA: Diagnosis not present

## 2021-07-22 DIAGNOSIS — N433 Hydrocele, unspecified: Secondary | ICD-10-CM | POA: Diagnosis not present

## 2021-07-22 DIAGNOSIS — N50811 Right testicular pain: Secondary | ICD-10-CM | POA: Insufficient documentation

## 2021-07-22 DIAGNOSIS — N50819 Testicular pain, unspecified: Secondary | ICD-10-CM

## 2021-07-22 DIAGNOSIS — I10 Essential (primary) hypertension: Secondary | ICD-10-CM | POA: Diagnosis not present

## 2021-07-22 LAB — URINALYSIS, ROUTINE W REFLEX MICROSCOPIC
Glucose, UA: NEGATIVE mg/dL
Ketones, ur: NEGATIVE mg/dL
Nitrite: NEGATIVE
Protein, ur: 30 mg/dL — AB
Specific Gravity, Urine: 1.02 (ref 1.005–1.030)
pH: 6 (ref 5.0–8.0)

## 2021-07-22 LAB — CBC WITH DIFFERENTIAL/PLATELET
Abs Immature Granulocytes: 0.13 10*3/uL — ABNORMAL HIGH (ref 0.00–0.07)
Basophils Absolute: 0.1 10*3/uL (ref 0.0–0.1)
Basophils Relative: 0 %
Eosinophils Absolute: 0 10*3/uL (ref 0.0–0.5)
Eosinophils Relative: 0 %
HCT: 36.6 % — ABNORMAL LOW (ref 39.0–52.0)
Hemoglobin: 12.4 g/dL — ABNORMAL LOW (ref 13.0–17.0)
Immature Granulocytes: 1 %
Lymphocytes Relative: 8 %
Lymphs Abs: 1.1 10*3/uL (ref 0.7–4.0)
MCH: 29.2 pg (ref 26.0–34.0)
MCHC: 33.9 g/dL (ref 30.0–36.0)
MCV: 86.3 fL (ref 80.0–100.0)
Monocytes Absolute: 0.9 10*3/uL (ref 0.1–1.0)
Monocytes Relative: 6 %
Neutro Abs: 12.9 10*3/uL — ABNORMAL HIGH (ref 1.7–7.7)
Neutrophils Relative %: 85 %
Platelets: 141 10*3/uL — ABNORMAL LOW (ref 150–400)
RBC: 4.24 MIL/uL (ref 4.22–5.81)
RDW: 13.1 % (ref 11.5–15.5)
WBC: 15.1 10*3/uL — ABNORMAL HIGH (ref 4.0–10.5)
nRBC: 0 % (ref 0.0–0.2)

## 2021-07-22 LAB — BASIC METABOLIC PANEL
Anion gap: 10 (ref 5–15)
BUN: 11 mg/dL (ref 8–23)
CO2: 21 mmol/L — ABNORMAL LOW (ref 22–32)
Calcium: 9.1 mg/dL (ref 8.9–10.3)
Chloride: 100 mmol/L (ref 98–111)
Creatinine, Ser: 1.16 mg/dL (ref 0.61–1.24)
GFR, Estimated: >60 mL/min (ref >60)
Glucose, Bld: 168 mg/dL — ABNORMAL HIGH (ref 70–99)
Potassium: 3.9 mmol/L (ref 3.5–5.1)
Sodium: 131 mmol/L — ABNORMAL LOW (ref 135–145)

## 2021-07-22 MED ORDER — SODIUM CHLORIDE 0.9 % IV BOLUS
1000.0000 mL | Freq: Once | INTRAVENOUS | Status: AC
  Administered 2021-07-22: 1000 mL via INTRAVENOUS

## 2021-07-22 MED ORDER — HYDROMORPHONE HCL 1 MG/ML IJ SOLN
0.5000 mg | Freq: Once | INTRAMUSCULAR | Status: AC
  Administered 2021-07-22: 0.5 mg via INTRAVENOUS
  Filled 2021-07-22: qty 1

## 2021-07-22 MED ORDER — ONDANSETRON HCL 4 MG/2ML IJ SOLN
4.0000 mg | Freq: Once | INTRAMUSCULAR | Status: AC
  Administered 2021-07-22: 4 mg via INTRAVENOUS
  Filled 2021-07-22: qty 2

## 2021-07-22 MED ORDER — HYDROCODONE-ACETAMINOPHEN 5-325 MG PO TABS
1.0000 | ORAL_TABLET | Freq: Four times a day (QID) | ORAL | 0 refills | Status: DC | PRN
Start: 2021-07-22 — End: 2022-01-25

## 2021-07-22 NOTE — Discharge Instructions (Signed)
Please read and follow all provided instructions.  Your diagnoses today include:  1. Testicle pain     Tests performed today include: Blood cell counts and platelets - high white blood cell Kidney and liver function tests Urine test to look for infection - no large amount of blood Ultrasound of your scrotum - no serious problems seen Vital signs. See below for your results today.   Medications prescribed:  Vicodin (hydrocodone/acetaminophen) - narcotic pain medication  DO NOT drive or perform any activities that require you to be awake and alert because this medicine can make you drowsy. BE VERY CAREFUL not to take multiple medicines containing Tylenol (also called acetaminophen). Doing so can lead to an overdose which can damage your liver and cause liver failure and possibly death.  Take any prescribed medications only as directed.  Home care instructions:  Follow any educational materials contained in this packet.  Follow-up instructions: Please follow-up with your primary care provider in the next 3 days for further evaluation of your symptoms.    Return instructions:  SEEK IMMEDIATE MEDICAL ATTENTION IF: The pain does not go away or becomes severe  A temperature above 101F develops  Repeated vomiting occurs (multiple episodes)  The pain becomes localized to portions of the abdomen. The right side could possibly be appendicitis. In an adult, the left lower portion of the abdomen could be colitis or diverticulitis.  Blood is being passed in stools or vomit (bright red or black tarry stools)  You develop chest pain, difficulty breathing, dizziness or fainting, or become confused, poorly responsive, or inconsolable (young children) If you have any other emergent concerns regarding your health  Additional Information: Abdominal (belly) pain can be caused by many things. Your caregiver performed an examination and possibly ordered blood/urine tests and imaging (CT scan, x-rays,  ultrasound). Many cases can be observed and treated at home after initial evaluation in the emergency department. Even though you are being discharged home, abdominal pain can be unpredictable. Therefore, you need a repeated exam if your pain does not resolve, returns, or worsens. Most patients with abdominal pain don't have to be admitted to the hospital or have surgery, but serious problems like appendicitis and gallbladder attacks can start out as nonspecific pain. Many abdominal conditions cannot be diagnosed in one visit, so follow-up evaluations are very important.  Your vital signs today were: BP (!) 156/103 (BP Location: Left Arm)   Pulse 75   Temp 98.1 F (36.7 C) (Oral)   Resp 17   Ht 5\' 7"  (1.702 m)   Wt 99.3 kg   SpO2 97%   BMI 34.29 kg/m  If your blood pressure (bp) was elevated above 135/85 this visit, please have this repeated by your doctor within one month. --------------

## 2021-07-22 NOTE — ED Triage Notes (Signed)
Complains of R testicle pain and swelling since last night, constant pain that has gotten worse. Tried NSAIDs w/o relief. Denies N/V/D or fevers, denies urinary symptoms.

## 2021-07-22 NOTE — ED Provider Notes (Signed)
[facility]   CSN: [id number] Arrival date & time: 07/22/21  2992     History No chief complaint on file.   Lee Johnston is a 60 y.o. male.  Patient presents emergency department for cute onset of gradually worsening right-sided testicle pain with radiation into his right groin and pelvis.  Patient states that the pain started yesterday after lunchtime.  He denies injury or trauma.  During the course of the evening, his symptoms became worse.  He has had nausea but no vomiting.  No severe flank or back pain.  No fevers.  He denies dysuria, hematuria, increased frequency or urgency.  He was having difficulty sleeping last night due to the severe pain.  No history of kidney stones.  He is sexually active.  He denies concerns for sexually transmitted infection.  Denies penile discharge.       Past Medical History:  Diagnosis Date   Alcohol abuse    Arthritis    WRIST   Cirrhosis (HCC)    Gout    HBP (high blood pressure)    Hepatitis    Hypertension     Patient Active Problem List   Diagnosis Date Noted   Liver fibrosis 09/10/2015   Hepatic cirrhosis (HCC) 09/10/2015   Alcohol abuse 09/10/2015   Chronic hepatitis C without hepatic coma (HCC) 07/29/2015   Seasonal allergic rhinitis 07/29/2015    Past Surgical History:  Procedure Laterality Date   COLONOSCOPY     POLYPECTOMY         No family history on file.  Social History   Tobacco Use   Smoking status: Former    Packs/day: 0.25    Types: Cigarettes   Smokeless tobacco: Never   Tobacco comments:    cutting back  Substance Use Topics   Alcohol use: Yes    Alcohol/week: 0.0 standard drinks    Comment: states drinks wine and beer everyday; liquor occasional, last dringk 07/31/2015   Drug use: Yes    Types: Cocaine    Comment: patient states used crack cocaine 7 days ago, not for several months    Home Medications Prior to Admission medications    Medication Sig Start Date End Date Taking? Authorizing Provider  Acetaminophen (TYLENOL PO) Take by mouth.    [provider]  allopurinol (ZYLOPRIM) 300 MG tablet Take 300 mg by mouth daily as needed (for gout).    [provider]  amLODipine (NORVASC) 10 MG tablet Take 10 mg by mouth daily.    [provider]  atenolol (TENORMIN) 25 MG tablet Take by mouth daily.    [provider]  lisinopril (PRINIVIL,ZESTRIL) 40 MG tablet  10/31/15   [provider]  naproxen sodium (ANAPROX) 220 MG tablet Take 220 mg by mouth daily as needed (for pain). Reported on 02/25/2016    [provider]  Vitamin D, Ergocalciferol, (DRISDOL) 50000 UNITS CAPS capsule Take 50,000 Units by mouth every 7 (seven) days. On wednesdays    [provider]    Allergies    Patient has no known allergies.  Review of Systems   Review of Systems  Constitutional:  Negative for fever.  HENT:  Negative for rhinorrhea and sore throat.   Eyes:  Negative for redness.  Respiratory:  Negative for cough.   Cardiovascular:  Negative for chest pain.  Gastrointestinal:  Negative for abdominal pain, diarrhea, nausea and vomiting.  Genitourinary:  Positive for scrotal swelling. Negative for dysuria, flank  pain, hematuria and penile discharge.  Musculoskeletal:  Negative for myalgias.  Skin:  Negative for rash.  Neurological:  Negative for headaches.   Physical Exam Updated Vital Signs There were no vitals taken for this visit.  Physical Exam Vitals and nursing note reviewed.  Constitutional:      General: He is in acute distress (appears uncomfortable).     Appearance: He is well-developed.  HENT:     Head: Normocephalic and atraumatic.  Eyes:     General:        Right eye: No discharge.        Left eye: No discharge.     Conjunctiva/sclera: Conjunctivae normal.  Cardiovascular:     Rate and Rhythm: Normal rate and regular rhythm.     Heart sounds: Normal  heart sounds.  Pulmonary:     Effort: Pulmonary effort is normal.     Breath sounds: Normal breath sounds.  Abdominal:     Palpations: Abdomen is soft.     Tenderness: There is no abdominal tenderness. There is no guarding or rebound.  Genitourinary:    Testes:        Right: Tenderness and swelling present.        Left: Tenderness or swelling not present.  Musculoskeletal:     Cervical back: Normal range of motion and neck supple.  Skin:    General: Skin is warm and dry.  Neurological:     Mental Status: He is alert.    ED Results / Procedures / Treatments   Labs (all labs ordered are listed, but only abnormal results are displayed) Labs Reviewed  CBC WITH DIFFERENTIAL/PLATELET - Abnormal; Notable for the following components:      Result Value   WBC 15.1 (*)    Hemoglobin 12.4 (*)    HCT 36.6 (*)    Platelets 141 (*)    Neutro Abs 12.9 (*)    Abs Immature Granulocytes 0.13 (*)    All other components within normal limits  BASIC METABOLIC PANEL - Abnormal; Notable for the following components:   Sodium 131 (*)    CO2 21 (*)    Glucose, Bld 168 (*)    All other components within normal limits  URINALYSIS, ROUTINE W REFLEX MICROSCOPIC - Abnormal; Notable for the following components:   APPearance CLEAR (*)    Hgb urine dipstick SMALL (*)    Bilirubin Urine SMALL (*)    Protein, ur 30 (*)    Leukocytes,Ua TRACE (*)    Bacteria, UA RARE (*)    All other components within normal limits  GC/CHLAMYDIA PROBE AMP ([place]) NOT AT [facility]    EKG None  Radiology No results found.  Procedures Procedures   Medications Ordered in ED Medications  sodium chloride 0.9 % bolus 1,000 mL (1,000 mLs Intravenous New Bag/Given 07/22/21 0736)  HYDROmorphone (DILAUDID) injection 0.5 mg (0.5 mg Intravenous Given 07/22/21 0737)  ondansetron (ZOFRAN) injection 4 mg (4 mg Intravenous Given 07/22/21 0737)    ED Course  I have reviewed the triage vital signs and the nursing  notes.  Pertinent labs & imaging results that were available during my care of the patient were reviewed by me and considered in my medical decision making (see chart for details).  Patient seen and examined. Work-up initiated. Medications ordered.   Vital signs reviewed and are as follows: BP (!) 139/91 (BP Location: Left Arm)   Pulse 82   Temp 98.1 F (36.7 C) (Oral)   Resp 20  SpO2 99%   9:15 AM [place] without evidence of torsion. Read did not cross over into Epic and I had to pull up imaging. Awaiting urine result.   11:20 AM UA without large amount of blood or signs of infection.  On recheck, patient states that he is feeling much better.  On reexam, no tenderness in the right pelvis or lower abdomen.  We discussed possible etiologies including small kidney stone that is passing.  Also discussed possible musculoskeletal causes.  Patient is comfortable discharged home at this time without further work-up.  I agree, given improvement in symptoms.  Will provide #4 Vicodin 5/325 for pain.  Patient counseled on use of narcotic pain medications. Counseled not to combine these medications with others containing tylenol. Urged not to drink alcohol, drive, or perform any other activities that requires focus while taking these medications. The patient verbalizes understanding and agrees with the plan.  The patient was urged to return to the Emergency Department immediately with worsening of current symptoms, worsening abdominal pain, persistent vomiting, blood noted in stools, fever, or any other concerns. The patient verbalized understanding.       MDM Rules/Calculators/A&P                           Patient presents with right-sided testicular pain and tenderness.  Pain radiated into the pelvis and lower abdomen.  UA with elevated white blood cell count, likely in part due to pain as patient was extremely uncomfortable on arrival.  Ultrasound of the scrotum did not demonstrate any emergent  findings, including torsion.  No large hemoglobin and the UA, however small kidney stone is not ruled out.  Patient's symptoms have improved with treatment here and remained controlled.  At this point he looks extremely well and is ready for discharge to home.  We discussed return instructions as above.   Final Clinical Impression(s) / ED Diagnoses Final diagnoses:  Testicle pain    Rx / DC Orders ED Discharge Orders          Ordered    HYDROcodone-acetaminophen (NORCO/VICODIN) 5-325 MG tablet  Every 6 hours PRN        07/22/21 1117             Renne Crigler, PA-C 07/22/21 1123    [place], Adam, DO 07/22/21 1247

## 2021-07-23 LAB — GC/CHLAMYDIA PROBE AMP (~~LOC~~) NOT AT ARMC
Chlamydia: NEGATIVE
Comment: NEGATIVE
Comment: NORMAL
Neisseria Gonorrhea: NEGATIVE

## 2021-11-18 DIAGNOSIS — K219 Gastro-esophageal reflux disease without esophagitis: Secondary | ICD-10-CM | POA: Diagnosis not present

## 2021-11-18 DIAGNOSIS — Z1159 Encounter for screening for other viral diseases: Secondary | ICD-10-CM | POA: Diagnosis not present

## 2021-11-18 DIAGNOSIS — Z79899 Other long term (current) drug therapy: Secondary | ICD-10-CM | POA: Diagnosis not present

## 2021-11-18 DIAGNOSIS — Z125 Encounter for screening for malignant neoplasm of prostate: Secondary | ICD-10-CM | POA: Diagnosis not present

## 2021-11-18 DIAGNOSIS — M109 Gout, unspecified: Secondary | ICD-10-CM | POA: Diagnosis not present

## 2021-11-18 DIAGNOSIS — I10 Essential (primary) hypertension: Secondary | ICD-10-CM | POA: Diagnosis not present

## 2021-11-18 DIAGNOSIS — F17211 Nicotine dependence, cigarettes, in remission: Secondary | ICD-10-CM | POA: Diagnosis not present

## 2021-11-18 DIAGNOSIS — E785 Hyperlipidemia, unspecified: Secondary | ICD-10-CM | POA: Diagnosis not present

## 2021-12-23 ENCOUNTER — Ambulatory Visit
Admission: RE | Admit: 2021-12-23 | Discharge: 2021-12-23 | Disposition: A | Discharge status: Home / Self Care | Payer: Medicare HMO | Source: Ambulatory Visit | Attending: Student | Admitting: Student

## 2021-12-23 ENCOUNTER — Other Ambulatory Visit: Payer: Self-pay | Admitting: Student

## 2021-12-23 DIAGNOSIS — R109 Unspecified abdominal pain: Secondary | ICD-10-CM

## 2022-01-25 ENCOUNTER — Encounter (HOSPITAL_COMMUNITY): Payer: Self-pay | Admitting: Emergency Medicine

## 2022-01-25 ENCOUNTER — Inpatient Hospital Stay (HOSPITAL_COMMUNITY)
Admission: EM | Admit: 2022-01-25 | Discharge: 2022-01-28 | DRG: 638 | Disposition: A | Discharge status: Home / Self Care | Payer: Medicare HMO | Source: Ambulatory Visit | Attending: Internal Medicine | Admitting: Internal Medicine

## 2022-01-25 ENCOUNTER — Other Ambulatory Visit: Payer: Self-pay

## 2022-01-25 DIAGNOSIS — E785 Hyperlipidemia, unspecified: Secondary | ICD-10-CM | POA: Diagnosis present

## 2022-01-25 DIAGNOSIS — E1110 Type 2 diabetes mellitus with ketoacidosis without coma: Principal | ICD-10-CM | POA: Diagnosis present

## 2022-01-25 DIAGNOSIS — N179 Acute kidney failure, unspecified: Secondary | ICD-10-CM | POA: Diagnosis present

## 2022-01-25 DIAGNOSIS — I10 Essential (primary) hypertension: Secondary | ICD-10-CM | POA: Diagnosis present

## 2022-01-25 DIAGNOSIS — Z20822 Contact with and (suspected) exposure to covid-19: Secondary | ICD-10-CM | POA: Diagnosis present

## 2022-01-25 DIAGNOSIS — E871 Hypo-osmolality and hyponatremia: Secondary | ICD-10-CM

## 2022-01-25 DIAGNOSIS — M19039 Primary osteoarthritis, unspecified wrist: Secondary | ICD-10-CM | POA: Diagnosis present

## 2022-01-25 DIAGNOSIS — M109 Gout, unspecified: Secondary | ICD-10-CM | POA: Diagnosis present

## 2022-01-25 DIAGNOSIS — B182 Chronic viral hepatitis C: Secondary | ICD-10-CM | POA: Diagnosis present

## 2022-01-25 DIAGNOSIS — Z86010 Personal history of colonic polyps: Secondary | ICD-10-CM

## 2022-01-25 DIAGNOSIS — D649 Anemia, unspecified: Secondary | ICD-10-CM | POA: Diagnosis present

## 2022-01-25 DIAGNOSIS — K7460 Unspecified cirrhosis of liver: Secondary | ICD-10-CM | POA: Diagnosis present

## 2022-01-25 DIAGNOSIS — G4733 Obstructive sleep apnea (adult) (pediatric): Secondary | ICD-10-CM | POA: Diagnosis present

## 2022-01-25 DIAGNOSIS — Z87891 Personal history of nicotine dependence: Secondary | ICD-10-CM

## 2022-01-25 DIAGNOSIS — E875 Hyperkalemia: Secondary | ICD-10-CM | POA: Diagnosis present

## 2022-01-25 DIAGNOSIS — Z79899 Other long term (current) drug therapy: Secondary | ICD-10-CM

## 2022-01-25 LAB — CBG MONITORING, ED
Glucose-Capillary: 384 mg/dL — ABNORMAL HIGH (ref 70–99)
Glucose-Capillary: 458 mg/dL — ABNORMAL HIGH (ref 70–99)
Glucose-Capillary: >600 mg/dL (ref 70–99)

## 2022-01-25 LAB — BASIC METABOLIC PANEL
Anion gap: 18 — ABNORMAL HIGH (ref 5–15)
BUN: 35 mg/dL — ABNORMAL HIGH (ref 8–23)
CO2: 17 mmol/L — ABNORMAL LOW (ref 22–32)
Calcium: 9.9 mg/dL (ref 8.9–10.3)
Chloride: 86 mmol/L — ABNORMAL LOW (ref 98–111)
Creatinine, Ser: 1.56 mg/dL — ABNORMAL HIGH (ref 0.61–1.24)
GFR, Estimated: 50 mL/min — ABNORMAL LOW (ref >60)
Glucose, Bld: 609 mg/dL (ref 70–99)
Potassium: 6.1 mmol/L — ABNORMAL HIGH (ref 3.5–5.1)
Sodium: 121 mmol/L — ABNORMAL LOW (ref 135–145)

## 2022-01-25 LAB — I-STAT CHEM 8, ED
BUN: 36 mg/dL — ABNORMAL HIGH (ref 8–23)
Calcium, Ion: 1.28 mmol/L (ref 1.15–1.40)
Chloride: 92 mmol/L — ABNORMAL LOW (ref 98–111)
Creatinine, Ser: 1.2 mg/dL (ref 0.61–1.24)
Glucose, Bld: 496 mg/dL — ABNORMAL HIGH (ref 70–99)
HCT: 35 % — ABNORMAL LOW (ref 39.0–52.0)
Hemoglobin: 11.9 g/dL — ABNORMAL LOW (ref 13.0–17.0)
Potassium: 5 mmol/L (ref 3.5–5.1)
Sodium: 125 mmol/L — ABNORMAL LOW (ref 135–145)
TCO2: 24 mmol/L (ref 22–32)

## 2022-01-25 LAB — BLOOD GAS, VENOUS
Acid-base deficit: 4.5 mmol/L — ABNORMAL HIGH (ref 0.0–2.0)
Bicarbonate: 21.7 mmol/L (ref 20.0–28.0)
O2 Saturation: 56.7 %
Patient temperature: 37
pCO2, Ven: 43 mmHg — ABNORMAL LOW (ref 44–60)
pH, Ven: 7.31 (ref 7.25–7.43)
pO2, Ven: 34 mmHg (ref 32–45)

## 2022-01-25 LAB — CBC WITH DIFFERENTIAL/PLATELET
Abs Immature Granulocytes: 0.03 10*3/uL (ref 0.00–0.07)
Basophils Absolute: 0.1 10*3/uL (ref 0.0–0.1)
Basophils Relative: 1 %
Eosinophils Absolute: 0.2 10*3/uL (ref 0.0–0.5)
Eosinophils Relative: 3 %
HCT: 32.5 % — ABNORMAL LOW (ref 39.0–52.0)
Hemoglobin: 11.8 g/dL — ABNORMAL LOW (ref 13.0–17.0)
Immature Granulocytes: 0 %
Lymphocytes Relative: 32 %
Lymphs Abs: 2.4 10*3/uL (ref 0.7–4.0)
MCH: 28.8 pg (ref 26.0–34.0)
MCHC: 36.3 g/dL — ABNORMAL HIGH (ref 30.0–36.0)
MCV: 79.3 fL — ABNORMAL LOW (ref 80.0–100.0)
Monocytes Absolute: 0.8 10*3/uL (ref 0.1–1.0)
Monocytes Relative: 10 %
Neutro Abs: 3.9 10*3/uL (ref 1.7–7.7)
Neutrophils Relative %: 54 %
Platelets: 209 10*3/uL (ref 150–400)
RBC: 4.1 MIL/uL — ABNORMAL LOW (ref 4.22–5.81)
RDW: 12.7 % (ref 11.5–15.5)
WBC: 7.4 10*3/uL (ref 4.0–10.5)
nRBC: 0 % (ref 0.0–0.2)

## 2022-01-25 LAB — URINALYSIS, ROUTINE W REFLEX MICROSCOPIC
Bacteria, UA: NONE SEEN
Bilirubin Urine: NEGATIVE
Glucose, UA: >=500 mg/dL — AB
Hgb urine dipstick: NEGATIVE
Ketones, ur: 20 mg/dL — AB
Leukocytes,Ua: NEGATIVE
Nitrite: NEGATIVE
Protein, ur: NEGATIVE mg/dL
Specific Gravity, Urine: 1.023 (ref 1.005–1.030)
pH: 5 (ref 5.0–8.0)

## 2022-01-25 LAB — RESP PANEL BY RT-PCR (FLU A&B, COVID) ARPGX2
Influenza A by PCR: NEGATIVE
Influenza B by PCR: NEGATIVE
SARS Coronavirus 2 by RT PCR: NEGATIVE

## 2022-01-25 MED ORDER — INSULIN REGULAR(HUMAN) IN NACL 100-0.9 UT/100ML-% IV SOLN
INTRAVENOUS | Status: DC
  Administered 2022-01-25: 13 [IU]/h via INTRAVENOUS
  Filled 2022-01-25: qty 100

## 2022-01-25 MED ORDER — LACTATED RINGERS IV SOLN: INTRAVENOUS | Status: DC

## 2022-01-25 MED ORDER — LACTATED RINGERS IV BOLUS
20.0000 mL/kg | Freq: Once | INTRAVENOUS | Status: AC
Start: 2022-01-25 — End: 2022-01-26
  Administered 2022-01-25: 1878 mL via INTRAVENOUS

## 2022-01-25 MED ORDER — DEXTROSE IN LACTATED RINGERS 5 % IV SOLN: INTRAVENOUS | Status: DC

## 2022-01-25 MED ORDER — DEXTROSE 50 % IV SOLN: 0.0000 mL | INTRAVENOUS | Status: DC | PRN

## 2022-01-25 NOTE — ED Triage Notes (Signed)
Pt BIB EMS from MD office, c/o polydipsia, polyuria, and fatigue. A1C has increased with unexplained weight loss over the past month (from 233 lbs down to 207 lbs). Denies pain, glucometer reading "high", orthostatic dizziness. 250 fluid bolus, 20 gauge LAC ?

## 2022-01-25 NOTE — ED Provider Notes (Signed)
?[facility]-EMERGENCY DEPT ?Provider Note ? ? ?CSN: [id number] ?Arrival date & time: 01/25/22  1634 ? ?  ? ?History ? ?Chief Complaint  ?Patient presents with  ? Hyperglycemia  ? ? ?Lee Johnston is a 61 y.o. male. ? ? ?Hyperglycemia ?Associated symptoms: no chest pain, no fatigue and no shortness of breath   ? ?Patient presents to the ED for evaluation of elevated blood sugar.  Patient has had symptoms over the last month.  He has had weight loss.  He has had issues with polyuria and polydipsia.  He has felt fatigued and lightheaded.  Patient has felt that he was going to pass out a few times.  He denies having any trouble with any fevers or chills.  No chest pain or shortness of breath.  No abdominal pain.  No dysuria.  Patient went to his primary care doctor's office today and his blood sugar was unmeasurable and reading high.  Patient was sent to the ED for further evaluation ? ?Home Medications ?Prior to Admission medications   ?Medication Sig Start Date End Date Taking? Authorizing Provider  ?Acetaminophen (TYLENOL PO) Take by mouth.    [provider]  ?allopurinol (ZYLOPRIM) 300 MG tablet Take 300 mg by mouth daily as needed (for gout).    [provider]  ?amLODipine (NORVASC) 10 MG tablet Take 10 mg by mouth daily.    [provider]  ?atenolol (TENORMIN) 25 MG tablet Take by mouth daily.    [provider]  ?HYDROcodone-acetaminophen (NORCO/VICODIN) 5-325 MG tablet Take 1 tablet by mouth every 6 (six) hours as needed for severe pain. 07/22/21   Renne Crigler, PA-C  ?lisinopril (PRINIVIL,ZESTRIL) 40 MG tablet  10/31/15   [provider]  ?naproxen sodium (ANAPROX) 220 MG tablet Take 220 mg by mouth daily as needed (for pain). Reported on 02/25/2016    [provider]  ?Vitamin D, Ergocalciferol, (DRISDOL) 50000 UNITS CAPS capsule Take 50,000 Units by mouth every 7 (seven) days. On wednesdays    [provider]  ?    ? ?Allergies    ?Patient has no known allergies.   ? ?Review of Systems   ?Review of Systems  ?Constitutional:  Negative for fatigue.  ?Respiratory:  Negative for shortness of breath.   ?Cardiovascular:  Negative for chest pain.  ? ?Physical Exam ?Updated Vital Signs ?BP 139/83   Pulse 83   Temp 98.6 ?F (37 ?C) (Oral)   Resp 17   Ht 1.727 m (5\' 8" )   Wt 93.9 kg   SpO2 99%   BMI 31.47 kg/m?  ?Physical Exam ?Vitals and nursing note reviewed.  ?Constitutional:   ?   General: He is not in acute distress. ?   Appearance: He is well-developed.  ?HENT:  ?   Head: Normocephalic and atraumatic.  ?   Right Ear: External ear normal.  ?   Left Ear: External ear normal.  ?Eyes:  ?   General: No scleral icterus.    ?   Right eye: No discharge.     ?   Left eye: No discharge.  ?   Conjunctiva/sclera: Conjunctivae normal.  ?Neck:  ?   Trachea: No tracheal deviation.  ?Cardiovascular:  ?   Rate and Rhythm: Normal rate and regular rhythm.  ?Pulmonary:  ?   Effort: Pulmonary effort is normal. No respiratory distress.  ?   Breath sounds: Normal breath sounds. No stridor. No wheezing or rales.  ?Abdominal:  ?   General:  Bowel sounds are normal. There is no distension.  ?   Palpations: Abdomen is soft.  ?   Tenderness: There is no abdominal tenderness. There is no guarding or rebound.  ?Musculoskeletal:     ?   General: No tenderness or deformity.  ?   Cervical back: Neck supple.  ?Skin: ?   General: Skin is warm and dry.  ?   Findings: No rash.  ?Neurological:  ?   General: No focal deficit present.  ?   Mental Status: He is alert.  ?   Cranial Nerves: No cranial nerve deficit (no facial droop, extraocular movements intact, no slurred speech).  ?   Sensory: No sensory deficit.  ?   Motor: No abnormal muscle tone or seizure activity.  ?   Coordination: Coordination normal.  ?Psychiatric:     ?   Mood and Affect: Mood normal.  ? ? ?ED Results / Procedures / Treatments   ?Labs ?(all labs ordered are listed, but only abnormal  results are displayed) ?Labs Reviewed  ?CBC WITH DIFFERENTIAL/PLATELET - Abnormal; Notable for the following components:  ?    Result Value  ? RBC 4.10 (*)   ? Hemoglobin 11.8 (*)   ? HCT 32.5 (*)   ? MCV 79.3 (*)   ? MCHC 36.3 (*)   ? All other components within normal limits  ?URINALYSIS, ROUTINE W REFLEX MICROSCOPIC - Abnormal; Notable for the following components:  ? Color, Urine STRAW (*)   ? Glucose, UA >=500 (*)   ? Ketones, ur 20 (*)   ? All other components within normal limits  ?BLOOD GAS, VENOUS - Abnormal; Notable for the following components:  ? pCO2, Ven 43 (*)   ? Acid-base deficit 4.5 (*)   ? All other components within normal limits  ?BASIC METABOLIC PANEL - Abnormal; Notable for the following components:  ? Sodium 121 (*)   ? Potassium 6.1 (*)   ? Chloride 86 (*)   ? CO2 17 (*)   ? Glucose, Bld 609 (*)   ? BUN 35 (*)   ? Creatinine, Ser 1.56 (*)   ? GFR, Estimated 50 (*)   ? Anion gap 18 (*)   ? All other components within normal limits  ?CBG MONITORING, ED - Abnormal; Notable for the following components:  ? Glucose-Capillary >600 (*)   ? All other components within normal limits  ?I-STAT CHEM 8, ED - Abnormal; Notable for the following components:  ? Sodium 125 (*)   ? Chloride 92 (*)   ? BUN 36 (*)   ? Glucose, Bld 496 (*)   ? Hemoglobin 11.9 (*)   ? HCT 35.0 (*)   ? All other components within normal limits  ?CBG MONITORING, ED - Abnormal; Notable for the following components:  ? Glucose-Capillary 458 (*)   ? All other components within normal limits  ?RESP PANEL BY RT-PCR (FLU A&B, COVID) ARPGX2  ?BETA-HYDROXYBUTYRIC ACID  ? ? ?EKG ?None ? ?Radiology ?No results found. ? ?Procedures ?Procedures  ? ? ?Medications Ordered in ED ?Medications  ?insulin regular, human (MYXREDLIN) 100 units/ 100 mL infusion (13 Units/hr Intravenous New Bag/Given 01/25/22 2308)  ?lactated ringers infusion ( Intravenous New Bag/Given 01/25/22 2256)  ?dextrose 5 % in lactated ringers infusion (has no administration in time  range)  ?dextrose 50 % solution 0-50 mL (has no administration in time range)  ?lactated ringers bolus 1,878 mL (1,878 mLs Intravenous New Bag/Given 01/25/22 1750)  ? ? ?ED Course/ Medical  Decision Making/ A&P ?Clinical Course as of 01/25/22 2340  ?Tue Jan 25, 2022  ?1649 Records reviewed from the primary care doctor's office visit today.  Blood sugar was reading high.  Patient was sent to have laboratory testing and further evaluation.  They felt he may end up requiring admission depending on further work-up [JK]  ?1751 CBC with Differential (PNL)(!) ?Anemia noted, slightly decreased compared to previous values [JK]  ?1751 CBG monitoring, ED(!!) ?Blood sugar significantly elevated [JK]  ?1918 Blood gas, venous (at [facility], not at [facility])(!) ?No acidosis [JK]  ?2024 Checked on bmet.  Had to be sent to [facility] due to lipemia [JK]  ?2138 Notified that patient's labs are extremely light hemic.  They are unable to run his metabolic panel. [JK]  ?2308 Patient's metabolic panel does show a bicarb of 17 glucose of 609.  Anion gap is elevated 18 [JK]  ?  ?Clinical Course User Index ?[JK] Linwood Dibbles, MD  ? ?                        ?Medical Decision Making ?Amount and/or Complexity of Data Reviewed ?Labs: ordered. Decision-making details documented in ED Course. ? ?Risk ?Prescription drug management. ?Decision regarding hospitalization. ? ? ?Patient presented to the ED for evaluation of new onset diabetes.  Patient with polyuria and polydipsia.  Patient's laboratory test show elevated blood sugar with anion gap metabolic acidosis.  Presentation consistent with early DKA.  Fortunately patient not having any severe nausea and vomiting.  IV fluids have been initiated.  Potassium is increased at 6.1, this should correct with insulin infusion.  Plan admission to the hospital for further evaluation.  Case discussed with Dr. Allena Katz ? ? ? ? ? ? ? ?Final Clinical Impression(s) / ED Diagnoses ?Final diagnoses:  ?Diabetic ketoacidosis without  coma associated with type 2 diabetes mellitus (HCC)  ? ?  ?Linwood Dibbles, MD ?01/25/22 2340 ? ?

## 2022-01-25 NOTE — H&P (Signed)
?History and Physical  ? ? ?[name] PFX:[id number] DOB: 03-09-1961 DOA: 01/25/2022 ? ?PCP: Ellyn Hack, MD  ?Patient coming from: Home via EMS ? ?I have personally briefly reviewed patient's old medical records in [facility] ? ?Chief Complaint: Polydipsia, polyuria ? ?HPI: ?Lee Johnston is a 61 y.o. male with medical history significant for liver cirrhosis associated with chronic hepatitis C (treated with Harvoni) and alcohol use, hypertension, hyperlipidemia, gout, anemia, OSA who presented to the ED for evaluation of polydipsia, polyuria, fatigue, and weight loss. ? ?Patient reports about 1 week of polyuria, polydipsia, fatigue, lightheadedness/dizziness without fall or syncope.  He reports about 25 pound weight loss over the last month.  He has not had any associated nausea, vomiting, abdominal pain, dysuria, chest pain, dyspnea. ? ?He was seen by his PCP, Dr. Sherryll Burger with [facility] today for further evaluation.  Patient says his glucose with him 600.  His hemoglobin A1c was >13%.  He was subsequently sent to the ED for further evaluation. ? ?ED Course  Labs/Imaging on admission: I have personally reviewed following labs and imaging studies. ? ?Initial vitals showed BP 122/83, pulse 86, RR 17, temp 98.6 ?F, SPO2 98% on room air. ? ?Labs show serum glucose 609, bicarb 17, anion gap 18, potassium 6.1 (may be affected by hemolysis), sodium 121 (133 when corrected for hyperglycemia), BUN 35, creatinine 1.56 (baseline 1.0-1.1), WBC 7.4, hemoglobin 11.8, platelets 209,000. ? ?Urinalysis shows >500 glucose, 20 ketones, negative protein, negative nitrates, negative leukocytes, no bacteria microscopy.  SARS-CoV-2 and influenza PCR negative. ? ?Patient was given 1.8 liters LR and started on insulin infusion.  The hospitalist service was consulted to admit for further evaluation and management. ? ?Review of Systems: All systems reviewed and are negative except as documented in history of present  illness above. ? ?Past Medical History:  ?Diagnosis Date  ? Alcohol abuse   ? Arthritis   ? WRIST  ? Cirrhosis (HCC)   ? Gout   ? HBP (high blood pressure)   ? Hepatitis   ? Hypertension   ? ? ?Past Surgical History:  ?Procedure Laterality Date  ? COLONOSCOPY    ? POLYPECTOMY    ? ? ?Social History: ? reports that he has quit smoking. His smoking use included cigarettes. He smoked an average of .25 packs per day. He has never used smokeless tobacco. He reports current alcohol use. He reports current drug use. Drug: Cocaine. ? ?No Known Allergies ? ?History reviewed. No pertinent family history. ? ? ?Prior to Admission medications   ?Medication Sig Start Date End Date Taking? Authorizing Provider  ?Acetaminophen (TYLENOL PO) Take by mouth.    [provider]  ?allopurinol (ZYLOPRIM) 300 MG tablet Take 300 mg by mouth daily as needed (for gout).    [provider]  ?amLODipine (NORVASC) 10 MG tablet Take 10 mg by mouth daily.    [provider]  ?atenolol (TENORMIN) 25 MG tablet Take by mouth daily.    [provider]  ?HYDROcodone-acetaminophen (NORCO/VICODIN) 5-325 MG tablet Take 1 tablet by mouth every 6 (six) hours as needed for severe pain. 07/22/21   Renne Crigler, PA-C  ?lisinopril (PRINIVIL,ZESTRIL) 40 MG tablet  10/31/15   [provider]  ?naproxen sodium (ANAPROX) 220 MG tablet Take 220 mg by mouth daily as needed (for pain). Reported on 02/25/2016    [provider]  ?Vitamin D, Ergocalciferol, (DRISDOL) 50000 UNITS CAPS capsule Take 50,000 Units by mouth every  7 (seven) days. On wednesdays    [provider]  ? ? ?Physical Exam: ?Vitals:  ? 01/25/22 2115 01/25/22 2130 01/25/22 2145 01/25/22 2215  ?BP: 127/87 (!) 138/94 (!) 145/98 139/83  ?Pulse: 80 (!) 105 81 83  ?Resp: 17   17  ?Temp:      ?TempSrc:      ?SpO2: 100% 99% 99% 99%  ?Weight:      ?Height:      ? ?Constitutional: Resting in bed, NAD, calm, comfortable ?Eyes: PERRL, lids and  conjunctivae normal ?ENMT: Mucous membranes are dry. Posterior pharynx clear of any exudate or lesions.Normal dentition.  ?Neck: normal, supple, no masses. ?Respiratory: clear to auscultation bilaterally, no wheezing, no crackles. Normal respiratory effort. No accessory muscle use.  ?Cardiovascular: Regular rate and rhythm, no murmurs / rubs / gallops. No extremity edema. 2+ pedal pulses. ?Abdomen: no tenderness, no masses palpated. No hepatosplenomegaly. Bowel sounds positive.  ?Musculoskeletal: no clubbing / cyanosis. No joint deformity upper and lower extremities. Good ROM, no contractures. Normal muscle tone.  ?Skin: no rashes, lesions, ulcers. No induration ?Neurologic: CN 2-12 grossly intact. Sensation intact. Strength 5/5 in all 4.  ?Psychiatric: Normal judgment and insight. Alert and oriented x 3. Normal mood.  ? ?EKG: Ordered and pending. ?Assessment/Plan ?Principal Problem: ?  DKA (diabetic ketoacidosis) (HCC) ?Active Problems: ?  AKI (acute kidney injury) (HCC) ?  Hyperkalemia ?  Hypertension ?  Hepatic cirrhosis (HCC) ?  ?Lee Johnston is a 61 y.o. male with medical history significant for liver cirrhosis associated with chronic hepatitis C (treated with Harvoni) and alcohol use, hypertension, hyperlipidemia, gout, anemia, OSA who is admitted with DKA in setting of newly diagnosed diabetes. ? ?Assessment and Plan: ?* DKA (diabetic ketoacidosis) (HCC) ?Presenting with DKA in setting of newly diagnosed diabetes. ?-Started on insulin infusion per protocol ?-Continue IV fluids as ordered and transition to D5 LR when CBG <250 ?-Follow serial BMET and transition to subcutaneous insulin when able ?-Consult to diabetes coordinator ? ?AKI (acute kidney injury) (HCC) ?Secondary to DKA.  Continue IV fluid hydration and follow labs. ? ?Hyperkalemia ?Potassium 6.1 in setting of DKA, may be affected by some level of hemolysis.  Continue to monitor with IV fluids and insulin infusion. ? ?Hypertension ?Continue  amlodipine.  Hold lisinopril in setting of AKI. ? ?Hepatic cirrhosis (HCC) ?Chronic and stable without evidence of decompensation or encephalopathy.  History of hepatitis C s/p Harvoni treatment.  Denies any alcohol use for the last 5 months.  [facility] and states he just had an MRI completed to assess for spot on his liver. ?-[facility] ? ?DVT prophylaxis: enoxaparin (LOVENOX) injection 40 mg Start: 01/26/22 1000 ?Code Status: Full code, confirmed with patient on admission. ?Family Communication: Discussed with patient, he has discussed with family. ?Disposition Plan: From home and likely discharge to home pending clinical progress. ?Consults called: None ?Severity of Illness: ?The appropriate patient status for this patient is OBSERVATION. Observation status is judged to be reasonable and necessary in order to provide the required intensity of service to ensure the patient's safety. The patient's presenting symptoms, physical exam findings, and initial radiographic and laboratory data in the context of their medical condition is felt to place them at decreased risk for further clinical deterioration. Furthermore, it is anticipated that the patient will be medically stable for discharge from the hospital within 2 midnights of admission.   ?Darreld Mclean MD ?[facility] ? ?If 7PM-7AM, please contact  night-coverage ?[email or web address] ? ?01/26/2022, 12:09 AM  ?

## 2022-01-26 ENCOUNTER — Encounter (HOSPITAL_COMMUNITY): Payer: Self-pay | Admitting: Internal Medicine

## 2022-01-26 DIAGNOSIS — M109 Gout, unspecified: Secondary | ICD-10-CM | POA: Diagnosis present

## 2022-01-26 DIAGNOSIS — D649 Anemia, unspecified: Secondary | ICD-10-CM | POA: Diagnosis present

## 2022-01-26 DIAGNOSIS — Z86010 Personal history of colonic polyps: Secondary | ICD-10-CM | POA: Diagnosis not present

## 2022-01-26 DIAGNOSIS — E871 Hypo-osmolality and hyponatremia: Secondary | ICD-10-CM | POA: Diagnosis present

## 2022-01-26 DIAGNOSIS — I10 Essential (primary) hypertension: Secondary | ICD-10-CM | POA: Diagnosis present

## 2022-01-26 DIAGNOSIS — Z79899 Other long term (current) drug therapy: Secondary | ICD-10-CM | POA: Diagnosis not present

## 2022-01-26 DIAGNOSIS — B182 Chronic viral hepatitis C: Secondary | ICD-10-CM | POA: Diagnosis present

## 2022-01-26 DIAGNOSIS — E875 Hyperkalemia: Secondary | ICD-10-CM | POA: Diagnosis present

## 2022-01-26 DIAGNOSIS — Z20822 Contact with and (suspected) exposure to covid-19: Secondary | ICD-10-CM | POA: Diagnosis present

## 2022-01-26 DIAGNOSIS — G4733 Obstructive sleep apnea (adult) (pediatric): Secondary | ICD-10-CM | POA: Diagnosis present

## 2022-01-26 DIAGNOSIS — M19039 Primary osteoarthritis, unspecified wrist: Secondary | ICD-10-CM | POA: Diagnosis present

## 2022-01-26 DIAGNOSIS — E1110 Type 2 diabetes mellitus with ketoacidosis without coma: Secondary | ICD-10-CM | POA: Diagnosis not present

## 2022-01-26 DIAGNOSIS — K7460 Unspecified cirrhosis of liver: Secondary | ICD-10-CM | POA: Diagnosis present

## 2022-01-26 DIAGNOSIS — N179 Acute kidney failure, unspecified: Secondary | ICD-10-CM | POA: Diagnosis present

## 2022-01-26 DIAGNOSIS — E785 Hyperlipidemia, unspecified: Secondary | ICD-10-CM | POA: Diagnosis present

## 2022-01-26 DIAGNOSIS — Z87891 Personal history of nicotine dependence: Secondary | ICD-10-CM | POA: Diagnosis not present

## 2022-01-26 LAB — BASIC METABOLIC PANEL
Anion gap: 11 (ref 5–15)
Anion gap: 12 (ref 5–15)
Anion gap: 9 (ref 5–15)
Anion gap: 11 (ref 5–15)
BUN: 27 mg/dL — ABNORMAL HIGH (ref 8–23)
BUN: 20 mg/dL (ref 8–23)
BUN: 18 mg/dL (ref 8–23)
BUN: 18 mg/dL (ref 8–23)
CO2: 24 mmol/L (ref 22–32)
CO2: 23 mmol/L (ref 22–32)
CO2: 28 mmol/L (ref 22–32)
CO2: 23 mmol/L (ref 22–32)
Calcium: 10.1 mg/dL (ref 8.9–10.3)
Calcium: 10 mg/dL (ref 8.9–10.3)
Calcium: 9.8 mg/dL (ref 8.9–10.3)
Calcium: 9.3 mg/dL (ref 8.9–10.3)
Chloride: 95 mmol/L — ABNORMAL LOW (ref 98–111)
Chloride: 97 mmol/L — ABNORMAL LOW (ref 98–111)
Chloride: 97 mmol/L — ABNORMAL LOW (ref 98–111)
Chloride: 96 mmol/L — ABNORMAL LOW (ref 98–111)
Creatinine, Ser: 1.03 mg/dL (ref 0.61–1.24)
Creatinine, Ser: 0.89 mg/dL (ref 0.61–1.24)
Creatinine, Ser: 0.82 mg/dL (ref 0.61–1.24)
Creatinine, Ser: 0.98 mg/dL (ref 0.61–1.24)
GFR, Estimated: >60 mL/min (ref >60)
GFR, Estimated: >60 mL/min (ref >60)
GFR, Estimated: >60 mL/min (ref >60)
GFR, Estimated: >60 mL/min (ref >60)
Glucose, Bld: 183 mg/dL — ABNORMAL HIGH (ref 70–99)
Glucose, Bld: 176 mg/dL — ABNORMAL HIGH (ref 70–99)
Glucose, Bld: 147 mg/dL — ABNORMAL HIGH (ref 70–99)
Glucose, Bld: 355 mg/dL — ABNORMAL HIGH (ref 70–99)
Potassium: 3.4 mmol/L — ABNORMAL LOW (ref 3.5–5.1)
Potassium: 3.7 mmol/L (ref 3.5–5.1)
Potassium: 4 mmol/L (ref 3.5–5.1)
Potassium: 4 mmol/L (ref 3.5–5.1)
Sodium: 130 mmol/L — ABNORMAL LOW (ref 135–145)
Sodium: 132 mmol/L — ABNORMAL LOW (ref 135–145)
Sodium: 134 mmol/L — ABNORMAL LOW (ref 135–145)
Sodium: 130 mmol/L — ABNORMAL LOW (ref 135–145)

## 2022-01-26 LAB — GLUCOSE, CAPILLARY
Glucose-Capillary: 286 mg/dL — ABNORMAL HIGH (ref 70–99)
Glucose-Capillary: 199 mg/dL — ABNORMAL HIGH (ref 70–99)
Glucose-Capillary: 184 mg/dL — ABNORMAL HIGH (ref 70–99)
Glucose-Capillary: 180 mg/dL — ABNORMAL HIGH (ref 70–99)
Glucose-Capillary: 183 mg/dL — ABNORMAL HIGH (ref 70–99)
Glucose-Capillary: 186 mg/dL — ABNORMAL HIGH (ref 70–99)
Glucose-Capillary: 196 mg/dL — ABNORMAL HIGH (ref 70–99)
Glucose-Capillary: 194 mg/dL — ABNORMAL HIGH (ref 70–99)
Glucose-Capillary: 159 mg/dL — ABNORMAL HIGH (ref 70–99)
Glucose-Capillary: 169 mg/dL — ABNORMAL HIGH (ref 70–99)
Glucose-Capillary: 374 mg/dL — ABNORMAL HIGH (ref 70–99)
Glucose-Capillary: 410 mg/dL — ABNORMAL HIGH (ref 70–99)

## 2022-01-26 LAB — HIV ANTIBODY (ROUTINE TESTING W REFLEX): HIV Screen 4th Generation wRfx: NONREACTIVE

## 2022-01-26 LAB — CBC
HCT: 31.4 % — ABNORMAL LOW (ref 39.0–52.0)
Hemoglobin: 11.4 g/dL — ABNORMAL LOW (ref 13.0–17.0)
MCH: 28.2 pg (ref 26.0–34.0)
MCHC: 36.3 g/dL — ABNORMAL HIGH (ref 30.0–36.0)
MCV: 77.7 fL — ABNORMAL LOW (ref 80.0–100.0)
Platelets: 189 10*3/uL (ref 150–400)
RBC: 4.04 MIL/uL — ABNORMAL LOW (ref 4.22–5.81)
RDW: 12.7 % (ref 11.5–15.5)
WBC: 7.3 10*3/uL (ref 4.0–10.5)
nRBC: 0 % (ref 0.0–0.2)

## 2022-01-26 LAB — HEMOGLOBIN A1C
Hgb A1c MFr Bld: 12.1 % — ABNORMAL HIGH (ref 4.8–5.6)
Mean Plasma Glucose: 300.57 mg/dL

## 2022-01-26 LAB — BETA-HYDROXYBUTYRIC ACID
Beta-Hydroxybutyric Acid: 0.32 mmol/L — ABNORMAL HIGH (ref 0.05–0.27)
Beta-Hydroxybutyric Acid: 1.06 mmol/L — ABNORMAL HIGH (ref 0.05–0.27)
Beta-Hydroxybutyric Acid: 2.75 mmol/L — ABNORMAL HIGH (ref 0.05–0.27)

## 2022-01-26 LAB — CBG MONITORING, ED: Glucose-Capillary: 372 mg/dL — ABNORMAL HIGH (ref 70–99)

## 2022-01-26 LAB — MRSA NEXT GEN BY PCR, NASAL: MRSA by PCR Next Gen: NOT DETECTED

## 2022-01-26 MED ORDER — ATORVASTATIN CALCIUM 10 MG PO TABS
20.0000 mg | ORAL_TABLET | Freq: Every day | ORAL | Status: DC
  Administered 2022-01-26 – 2022-01-27 (×2): 20 mg via ORAL
  Filled 2022-01-26 (×2): qty 2

## 2022-01-26 MED ORDER — PANTOPRAZOLE SODIUM 40 MG PO TBEC
40.0000 mg | DELAYED_RELEASE_TABLET | Freq: Every evening | ORAL | Status: DC
  Administered 2022-01-26 – 2022-01-27 (×2): 40 mg via ORAL
  Filled 2022-01-26 (×2): qty 1

## 2022-01-26 MED ORDER — LACTATED RINGERS IV SOLN: INTRAVENOUS | Status: DC

## 2022-01-26 MED ORDER — INSULIN ASPART 100 UNIT/ML IJ SOLN
1.0000 [IU] | Freq: Once | INTRAMUSCULAR | Status: AC
  Administered 2022-01-26: 1 [IU] via SUBCUTANEOUS

## 2022-01-26 MED ORDER — ACETAMINOPHEN 650 MG RE SUPP: 650.0000 mg | Freq: Once | RECTAL | Status: DC | PRN

## 2022-01-26 MED ORDER — CHLORHEXIDINE GLUCONATE CLOTH 2 % EX PADS
6.0000 | MEDICATED_PAD | Freq: Every day | CUTANEOUS | Status: DC
  Administered 2022-01-26 – 2022-01-27 (×3): 6 via TOPICAL

## 2022-01-26 MED ORDER — INSULIN ASPART 100 UNIT/ML IJ SOLN
0.0000 [IU] | Freq: Every day | INTRAMUSCULAR | Status: DC
  Administered 2022-01-26: 5 [IU] via SUBCUTANEOUS
  Administered 2022-01-27: 22:00:00 2 [IU] via SUBCUTANEOUS

## 2022-01-26 MED ORDER — FERROUS SULFATE 325 (65 FE) MG PO TABS
325.0000 mg | ORAL_TABLET | Freq: Every day | ORAL | Status: DC
  Administered 2022-01-26 – 2022-01-27 (×2): 325 mg via ORAL
  Filled 2022-01-26 (×2): qty 1

## 2022-01-26 MED ORDER — INSULIN ASPART 100 UNIT/ML IJ SOLN
0.0000 [IU] | Freq: Three times a day (TID) | INTRAMUSCULAR | Status: DC
  Administered 2022-01-26: 3 [IU] via SUBCUTANEOUS
  Administered 2022-01-26 – 2022-01-27 (×2): 15 [IU] via SUBCUTANEOUS
  Administered 2022-01-27 (×2): 11 [IU] via SUBCUTANEOUS
  Administered 2022-01-28: 5 [IU] via SUBCUTANEOUS
  Administered 2022-01-28: 8 [IU] via SUBCUTANEOUS
  Administered 2022-01-28: 5 [IU] via SUBCUTANEOUS

## 2022-01-26 MED ORDER — INSULIN ASPART 100 UNIT/ML IJ SOLN: 5.0000 [IU] | Freq: Three times a day (TID) | INTRAMUSCULAR | Status: DC

## 2022-01-26 MED ORDER — ENOXAPARIN SODIUM 40 MG/0.4ML IJ SOSY
40.0000 mg | PREFILLED_SYRINGE | INTRAMUSCULAR | Status: DC
  Administered 2022-01-26 – 2022-01-28 (×3): 40 mg via SUBCUTANEOUS
  Filled 2022-01-26 (×3): qty 0.4

## 2022-01-26 MED ORDER — ACETAMINOPHEN 325 MG PO TABS
650.0000 mg | ORAL_TABLET | Freq: Once | ORAL | Status: AC | PRN
  Administered 2022-01-26: 650 mg via ORAL
  Filled 2022-01-26: qty 2

## 2022-01-26 MED ORDER — ALLOPURINOL 100 MG PO TABS
300.0000 mg | ORAL_TABLET | Freq: Every day | ORAL | Status: DC
  Administered 2022-01-26 – 2022-01-27 (×2): 300 mg via ORAL
  Filled 2022-01-26 (×2): qty 3

## 2022-01-26 MED ORDER — INSULIN STARTER KIT- PEN NEEDLES (ENGLISH)
1.0000 | Freq: Once | Status: AC
  Administered 2022-01-26: 1
  Filled 2022-01-26: qty 1

## 2022-01-26 MED ORDER — INSULIN GLARGINE-YFGN 100 UNIT/ML ~~LOC~~ SOLN
20.0000 [IU] | Freq: Every day | SUBCUTANEOUS | Status: DC
  Administered 2022-01-26: 20 [IU] via SUBCUTANEOUS
  Filled 2022-01-26 (×2): qty 0.2

## 2022-01-26 MED ORDER — LIVING WELL WITH DIABETES BOOK
Freq: Once | Status: AC
  Administered 2022-01-26: 1
  Filled 2022-01-26: qty 1

## 2022-01-26 MED ORDER — GABAPENTIN 300 MG PO CAPS
300.0000 mg | ORAL_CAPSULE | Freq: Every day | ORAL | Status: DC
  Administered 2022-01-26 – 2022-01-27 (×3): 300 mg via ORAL
  Filled 2022-01-26 (×3): qty 1

## 2022-01-26 MED ORDER — TRAMADOL HCL 50 MG PO TABS
50.0000 mg | ORAL_TABLET | Freq: Once | ORAL | Status: AC | PRN
  Administered 2022-01-26: 50 mg via ORAL
  Filled 2022-01-26: qty 1

## 2022-01-26 MED ORDER — INSULIN REGULAR(HUMAN) IN NACL 100-0.9 UT/100ML-% IV SOLN
INTRAVENOUS | Status: DC
  Administered 2022-01-26: 7.5 [IU]/h via INTRAVENOUS
  Filled 2022-01-26: qty 100

## 2022-01-26 MED ORDER — AMLODIPINE BESYLATE 10 MG PO TABS
10.0000 mg | ORAL_TABLET | Freq: Every day | ORAL | Status: DC
Start: 2022-01-26 — End: 2022-01-28
  Administered 2022-01-26 – 2022-01-28 (×3): 10 mg via ORAL
  Filled 2022-01-26 (×3): qty 1

## 2022-01-26 NOTE — Hospital Course (Signed)
Lee Johnston is a 61 y.o. male with medical history significant for liver cirrhosis associated with chronic hepatitis C (treated with Harvoni) and alcohol use, hypertension, hyperlipidemia, gout, anemia, OSA who is admitted with DKA in setting of newly diagnosed diabetes. ?

## 2022-01-26 NOTE — Progress Notes (Signed)
Pt refused cpap tonight.  Pt stated he has not used cpap at home in years and wants to wait until he gets another sleep study through the [place].  Pt was encouraged to call should he change his mind tonight. ?

## 2022-01-26 NOTE — Progress Notes (Signed)
?  Transition of Care (TOC) Screening Note ? ? ?Patient Details  ?Name: Lee Johnston ?Date of Birth: January 09, 1961 ? ? ?Transition of Care [facility]) CM/SW Contact:    ?Lanier Clam, RN ?Phone Number: ?01/26/2022, 10:24 AM ? ? ? ?Transition of Care Department [facility]) has reviewed patient and no TOC needs have been identified at this time. We will continue to monitor patient advancement through interdisciplinary progression rounds. If new patient transition needs arise, please place a TOC consult. ?  ?

## 2022-01-26 NOTE — Assessment & Plan Note (Signed)
Chronic and stable without evidence of decompensation or encephalopathy.  History of hepatitis C s/p Harvoni treatment.  Denies any alcohol use for the last 5 months.  [facility] and states he just had an MRI completed to assess for spot on his liver. ?-[facility] ?

## 2022-01-26 NOTE — Progress Notes (Signed)
?  Progress Note ? ? ?Patient: Lee Johnston [id number] DOB: 1961/01/27 DOA: 01/25/2022     0 ?DOS: the patient was seen and examined on 01/26/2022 ?  ?Brief hospital course: ?YEHUDA OKUBO is a 61 y.o. male with medical history significant for liver cirrhosis associated with chronic hepatitis C (treated with Harvoni) and alcohol use, hypertension, hyperlipidemia, gout, anemia, OSA who is admitted with DKA in setting of newly diagnosed diabetes. ? ?Assessment and Plan: ?* DKA (diabetic ketoacidosis) ([place]) ?Presenting with DKA in setting of newly diagnosed diabetes. ?-anion gap closed with insulin gtt and IVF hydration ?-Transitioned to 20 units semglee ?-Glucose remains in mid-300's. Butyric acid is trending up to over 2 with serum bicarb slight trend down ?-Will add scheduled 5 units meal coverage ?-Cont monitor closely ? ?AKI (acute kidney injury) ([place]) ?Secondary to DKA.  Continue IV fluid hydration and follow labs. ? ?Hyperkalemia ?Potassium 6.1 in setting of DKA, may be affected by some level of hemolysis.   ?-Improved with IVF ? ?Hypertension ?Continue amlodipine.  Hold lisinopril in setting of AKI. ? ?Hepatic cirrhosis ([place]) ?Chronic and stable without evidence of decompensation or encephalopathy.  History of hepatitis C s/p Harvoni treatment.  Denies any alcohol use for the last 5 months.  He is following with [facility] and states he just had an MRI completed to assess for spot on his liver. ?-Follow-up with [place] [facility] outpatient ? ? ?  ? ?Subjective: Reports feeling better ? ?Physical Exam: ?Vitals:  ? 01/26/22 1300 01/26/22 1400 01/26/22 1500 01/26/22 1600  ?BP: (!) 133/92 (!) 145/89 128/87 136/83  ?Pulse: 77 95 84 86  ?Resp: 19 17 20 17   ?Temp:      ?TempSrc:      ?SpO2: 97% 97% 97% 99%  ?Weight:      ?Height:      ? ?General exam: Awake, laying in bed, in nad ?Respiratory system: Normal respiratory effort, no wheezing ?Cardiovascular system: regular rate, s1, s2 ?Gastrointestinal  system: Soft, nondistended, positive BS ?Central nervous system: CN2-12 grossly intact, strength intact ?Extremities: Perfused, no clubbing ?Skin: Normal skin turgor, no notable skin lesions seen ?Psychiatry: Mood normal // no visual hallucinations  ? ?Data Reviewed: ? ?Labs reviewed. Sodium 130, butyric acid 2.75 from 1.06 ? ?Family Communication: Pt in room, family not at bedside ? ?Disposition: ?Status is: Inpatient ?Remains inpatient appropriate because: Severity of illness ? Planned Discharge Destination: Home ? ? ? ? ?Author: ?Marylu Lund, MD ?01/26/2022 4:32 PM ? ?For on call review [email or web address].  ?

## 2022-01-26 NOTE — Assessment & Plan Note (Addendum)
Potassium 6.1 in setting of DKA, may be affected by some level of hemolysis.   ?-resolved with IVF ?

## 2022-01-26 NOTE — Assessment & Plan Note (Addendum)
Secondary to DKA. ?Resolved with IVF ? ?

## 2022-01-26 NOTE — Progress Notes (Signed)
Insulin gtt and associated fluids stopped per orders at 1102, 2 hrs after SQ insulin given. This RN will continue to monitor CBGs ACHS.  ?

## 2022-01-26 NOTE — Progress Notes (Signed)
Patient states he has not used a CPAP at home in approximately 3 years and conveys that the [place] has sent for a referral to determine if he still requires one. However, at this time he has declined nocturnal use and does not wish to use one until the [place] determines it is needed. RN aware of patient decline.  ?

## 2022-01-26 NOTE — Assessment & Plan Note (Addendum)
Continue amlodipine.   ?Held ACEI secondary to ARF. Given normalized renal function, would resume ACEI ?

## 2022-01-26 NOTE — Assessment & Plan Note (Addendum)
Presenting with DKA in setting of newly diagnosed diabetes. ?-A1c 12.1 ?-anion gap closed with insulin gtt and IVF hydration ?-Transitioned to 24 units bid ?-with meal coverage increased to 10 units TID ?-Continue to titrate insulin with goal of euglycemia ?

## 2022-01-26 NOTE — Progress Notes (Addendum)
Inpatient Diabetes Program Recommendations ? ?AACE/ADA: New Consensus Statement on Inpatient Glycemic Control (2015) ? ?Target Ranges:  Prepandial:   less than 140 mg/dL ?     Peak postprandial:   less than 180 mg/dL (1-2 hours) ?     Critically ill patients:  140 - 180 mg/dL  ? ?Lab Results  ?Component Value Date  ? GLUCAP 196 (H) 01/26/2022  ? HGBA1C 12.1 (H) 01/26/2022  ? ? ?Diabetes history: New DM ?Outpatient Diabetes medications: none ?Current orders for Inpatient glycemic control: IV insulin ? ?Inpatient Diabetes Program Recommendations:   ? ?New DM/DKA ? ?When MD is ready to transition to SQ insulin, please consider: ? ?1-Semglee 18 units 2 hours prior to turning off Endo tool ?2-Novolog 0-15 units TID and 0-5 units QHS ?3-Carb modified diet ? ?Will speak with patient today.   ? ?Addendum_0 :36: ?Spoke with patient at bedside. Discussed A1C results with him (12.1%=301 mg/dL average) and explained what an A1C is, basic pathophysiology of DM Type 2, basic home care, basic diabetes diet nutrition principles, importance of checking CBGs and maintaining good CBG control to prevent long-term and short-term complications. Reviewed signs and symptoms of hyperglycemia and hypoglycemia and how to treat hypoglycemia at home. Also reviewed blood sugar goals at home.  ?RNs to provide ongoing basic DM education at bedside with this patient. Have ordered educational booklet, insulin starter kit, and DM videos. Have also placed RD consult for DM diet education for this patient.  ? ?He has transitioned to SQ insulin this morning.  He has a PCP at [facility] and is current with [facility].  He was told several times he has pre-DM but was never educated or placed on medication.  He began to have increased thirst, urination and weight loss about 1 month ago when he had a steroid injection.   ? ?Educated on The plate Method, CHOs and serving sizes.  Will teach the insulin pen when his significant other is present.    ? ?Please use each patient interaction to provide diabetes education. Please review Living Well with Diabetes booklet with the patient, have patient watch patient education videos on diabetes, and instruct on insulin administration. Please allow patient to be actively engaged with diabetes management by allowing patient to check own glucose and self-administer insulin injections. Diabetes Coordinator will follow up with patient and reinforce diabetes education. ? ? ? ?Will continue to follow while inpatient. ? ?Thank you, ?Reche Dixon, MSN, RN ?Diabetes Coordinator ?Inpatient Diabetes Program ?[phone] (team pager from 8a-5p) ? ? ?

## 2022-01-27 DIAGNOSIS — E1110 Type 2 diabetes mellitus with ketoacidosis without coma: Secondary | ICD-10-CM | POA: Diagnosis not present

## 2022-01-27 DIAGNOSIS — E875 Hyperkalemia: Secondary | ICD-10-CM | POA: Diagnosis not present

## 2022-01-27 DIAGNOSIS — E871 Hypo-osmolality and hyponatremia: Secondary | ICD-10-CM

## 2022-01-27 LAB — COMPREHENSIVE METABOLIC PANEL
ALT: 26 U/L (ref 0–44)
AST: 27 U/L (ref 15–41)
Albumin: 3.7 g/dL (ref 3.5–5.0)
Alkaline Phosphatase: 78 U/L (ref 38–126)
Anion gap: 11 (ref 5–15)
BUN: 14 mg/dL (ref 8–23)
CO2: 22 mmol/L (ref 22–32)
Calcium: 9.7 mg/dL (ref 8.9–10.3)
Chloride: 97 mmol/L — ABNORMAL LOW (ref 98–111)
Creatinine, Ser: 0.96 mg/dL (ref 0.61–1.24)
GFR, Estimated: >60 mL/min (ref >60)
Glucose, Bld: 281 mg/dL — ABNORMAL HIGH (ref 70–99)
Potassium: 3.6 mmol/L (ref 3.5–5.1)
Sodium: 130 mmol/L — ABNORMAL LOW (ref 135–145)
Total Bilirubin: 0.5 mg/dL (ref 0.3–1.2)
Total Protein: 7.9 g/dL (ref 6.5–8.1)

## 2022-01-27 LAB — BASIC METABOLIC PANEL
Anion gap: 11 (ref 5–15)
BUN: 12 mg/dL (ref 8–23)
CO2: 22 mmol/L (ref 22–32)
Calcium: 9.2 mg/dL (ref 8.9–10.3)
Chloride: 96 mmol/L — ABNORMAL LOW (ref 98–111)
Creatinine, Ser: 0.82 mg/dL (ref 0.61–1.24)
GFR, Estimated: >60 mL/min (ref >60)
Glucose, Bld: 310 mg/dL — ABNORMAL HIGH (ref 70–99)
Potassium: 3.6 mmol/L (ref 3.5–5.1)
Sodium: 129 mmol/L — ABNORMAL LOW (ref 135–145)

## 2022-01-27 LAB — BETA-HYDROXYBUTYRIC ACID
Beta-Hydroxybutyric Acid: 2.01 mmol/L — ABNORMAL HIGH (ref 0.05–0.27)
Beta-Hydroxybutyric Acid: 1.23 mmol/L — ABNORMAL HIGH (ref 0.05–0.27)

## 2022-01-27 LAB — GLUCOSE, CAPILLARY
Glucose-Capillary: 309 mg/dL — ABNORMAL HIGH (ref 70–99)
Glucose-Capillary: 359 mg/dL — ABNORMAL HIGH (ref 70–99)
Glucose-Capillary: 320 mg/dL — ABNORMAL HIGH (ref 70–99)
Glucose-Capillary: 234 mg/dL — ABNORMAL HIGH (ref 70–99)

## 2022-01-27 MED ORDER — INSULIN ASPART 100 UNIT/ML IJ SOLN
8.0000 [IU] | Freq: Three times a day (TID) | INTRAMUSCULAR | Status: DC
  Administered 2022-01-27 (×2): 8 [IU] via SUBCUTANEOUS

## 2022-01-27 MED ORDER — INSULIN GLARGINE-YFGN 100 UNIT/ML ~~LOC~~ SOLN
25.0000 [IU] | Freq: Every day | SUBCUTANEOUS | Status: DC
  Administered 2022-01-27: 09:00:00 25 [IU] via SUBCUTANEOUS
  Filled 2022-01-27: qty 0.25

## 2022-01-27 MED ORDER — KETOROLAC TROMETHAMINE 15 MG/ML IJ SOLN
15.0000 mg | Freq: Four times a day (QID) | INTRAMUSCULAR | Status: DC | PRN
  Administered 2022-01-27 – 2022-01-28 (×3): 15 mg via INTRAVENOUS
  Filled 2022-01-27 (×3): qty 1

## 2022-01-27 MED ORDER — INSULIN GLARGINE-YFGN 100 UNIT/ML ~~LOC~~ SOLN
20.0000 [IU] | Freq: Two times a day (BID) | SUBCUTANEOUS | Status: DC
Start: 2022-01-27 — End: 2022-01-28
  Administered 2022-01-27 – 2022-01-28 (×2): 20 [IU] via SUBCUTANEOUS
  Filled 2022-01-27 (×3): qty 0.2

## 2022-01-27 MED ORDER — INSULIN ASPART 100 UNIT/ML IJ SOLN
10.0000 [IU] | Freq: Three times a day (TID) | INTRAMUSCULAR | Status: DC
  Administered 2022-01-27 – 2022-01-28 (×4): 10 [IU] via SUBCUTANEOUS

## 2022-01-27 MED ORDER — INSULIN GLARGINE-YFGN 100 UNIT/ML ~~LOC~~ SOLN
5.0000 [IU] | Freq: Once | SUBCUTANEOUS | Status: AC
  Administered 2022-01-27: 14:00:00 5 [IU] via SUBCUTANEOUS
  Filled 2022-01-27: qty 0.05

## 2022-01-27 MED ORDER — INSULIN GLARGINE-YFGN 100 UNIT/ML ~~LOC~~ SOLN: 30.0000 [IU] | Freq: Every day | SUBCUTANEOUS | Status: DC

## 2022-01-27 NOTE — Progress Notes (Signed)
?  Progress Note ? ? ?Patient: Lee Johnston YME:[id number] DOB: July 03, 1961 DOA: 01/25/2022     1 ?DOS: the patient was seen and examined on 01/27/2022 ?  ?Brief hospital course: ?Lee Johnston is a 61 y.o. male with medical history significant for liver cirrhosis associated with chronic hepatitis C (treated with Harvoni) and alcohol use, hypertension, hyperlipidemia, gout, anemia, OSA who is admitted with DKA in setting of newly diagnosed diabetes. ? ?Assessment and Plan: ?* DKA (diabetic ketoacidosis) (HCC) ?Presenting with DKA in setting of newly diagnosed diabetes. ?-anion gap closed with insulin gtt and IVF hydration ?-Transitioned to 30 units semglee ?-Glucose remains in low-300's. Betahydroxybutyric acid trended to over 2.7 with values now slowly trending down to 1 this afternoon ?-Increase meal coverage to 10 units TID ?-Cont monitor closely ? ?AKI (acute kidney injury) (HCC) ?Secondary to DKA. ?Resolved with IVF ?Repeat bmet in AM ? ?Hyperkalemia ?Potassium 6.1 in setting of DKA, may be affected by some level of hemolysis.   ?-Improved with IVF ? ?Hypertension ?Continue amlodipine.   ?Held ACEI secondary to ARF. Given normalized renal function, would resume ACEI ? ?Hyponatremia ?Secondary to hyperglycemia ?Current corrected sodium of 134 given glucose of 310 ? ?Hepatic cirrhosis (HCC) ?Chronic and stable without evidence of decompensation or encephalopathy.  History of hepatitis C s/p Harvoni treatment.  Denies any alcohol use for the last 5 months.  [facility] and states he just had an MRI completed to assess for spot on his liver. ?-[facility] ? ? ?  ? ?Subjective: Without complaints this AM ? ?Physical Exam: ?Vitals:  ? 01/27/22 1200 01/27/22 1300 01/27/22 1400 01/27/22 1500  ?BP: (!) 149/103 (!) 146/90 117/79 127/74  ?Pulse: 98 91 83 88  ?Resp: (!) 27 17 17 19   ?Temp: 98.1 ?F (36.7 ?C)     ?TempSrc: Oral     ?SpO2: 100% 96% 98% 98%  ?Weight:       ?Height:      ? ?General exam: Conversant, in no acute distress ?Respiratory system: normal chest rise, clear, no audible wheezing ?Cardiovascular system: regular rhythm, s1-s2 ?Gastrointestinal system: Nondistended, nontender, pos BS ?Central nervous system: No seizures, no tremors ?Extremities: No cyanosis, no joint deformities ?Skin: No rashes, no pallor ?Psychiatry: Affect normal // no auditory hallucinations  ? ?Data Reviewed: ? ?Labs reviewed. Sodium 129 with betahydroxybutyric acid 1.23 down from 2.75 overnight ? ?Family Communication: Pt in room, family not at bedside ? ?Disposition: ?Status is: Inpatient ?Remains inpatient appropriate because: Severity of illness ? Planned Discharge Destination: Home ? ? ? ? ?Author: ? , MD ?01/27/2022 3:51 PM ? ?For on call review [email or web address].  ?

## 2022-01-27 NOTE — Progress Notes (Addendum)
Inpatient Diabetes Program Recommendations ? ?AACE/ADA: New Consensus Statement on Inpatient Glycemic Control (2015) ? ?Target Ranges:  Prepandial:   less than 140 mg/dL ?     Peak postprandial:   less than 180 mg/dL (1-2 hours) ?     Critically ill patients:  140 - 180 mg/dL  ? ?Lab Results  ?Component Value Date  ? GLUCAP 309 (H) 01/27/2022  ? HGBA1C 12.1 (H) 01/26/2022  ? ? ?Review of Glycemic Control ? Latest Reference Range & Units 01/26/22 09:58 01/26/22 11:02 01/26/22 16:34 01/26/22 21:42 01/27/22 07:04  ?Glucose-Capillary 70 - 99 mg/dL 159 (H) 169 (H) 374 (H) 410 (H) 309 (H)  ?(H): Data is abnormally high ? ? ?Diabetes history: New DM2 ? ?Current orders for Inpatient glycemic control: Semglee 25 units QAM, Novolog 0-15 units TID and 0-5 units QHS, Novolog 8 units TID ? ?Inpatient Diabetes Program Recommendations:   ? ?Might consider adding Metformin and increasing Semglee to 30 units daily. ? ?Addendum_0 :18: ?Educated patient on insulin pen use at home. Reviewed contents of insulin flexpen starter kit. Reviewed all steps of insulin pen including attachment of needle, 2-unit air shot, dialing up dose, giving injection, removing needle, disposal of sharps, storage of unused insulin, disposal of insulin etc. Patient able to provide successful return demonstration. Also reviewed troubleshooting with insulin pen. MD to give patient Rxs for insulin pens and insulin pen needles. ?Discussed the different types of insulin, basal and rapid.  Educated on when to administer.  He is interested in a [employer].   ? ?For DC please order: ? ?Lantus Solostar insulin pen order# [id number] ?Novolog Flexpen order# [id number] ?Insulin pen needles order# [id number] ?Glucometer Order# [id number] ? ?Addendum_1 : ? ?Semglee 20 BID (could give additional 20 units tonight).  Discussed with Dr. Wyline Copas.   ? ? ? ?Will continue to follow while inpatient. ? ?Thank you, ?Reche Dixon, MSN, RN ?Diabetes Coordinator ?Inpatient Diabetes  Program ?[phone] (team pager from 8a-5p) ? ? ? ? ? ?

## 2022-01-27 NOTE — Discharge Instructions (Signed)
Carbohydrate Counting For People With Diabetes ° °Foods with carbohydrates make your blood glucose level go up. Learning how to count carbohydrates can help you control your blood glucose levels. First, identify the foods you eat that contain carbohydrates. Then, using the Foods with Carbohydrates chart, determine about how much carbohydrates are in your meals and snacks. Make sure you are eating foods with fiber, protein, and healthy fat along with your carbohydrate foods. °Foods with Carbohydrates °The following table shows carbohydrate foods that have about 15 grams of carbohydrate each. Using measuring cups, spoons, or a food scale when you first begin learning about carbohydrate counting can help you learn about the portion sizes you typically eat. °The following foods have 15 grams carbohydrate each:  °Grains °1 slice bread (1 ounce)  °1 small tortilla (6-inch size)  °¼ large bagel (1 ounce)  °1/3 cup pasta or rice (cooked)  °½ hamburger or hot dog bun (¾ ounce)  °½ cup cooked cereal  °½ to ¾ cup ready-to-eat cereal  °2 taco shells (5-inch size) Fruit °1 small fresh fruit (¾ to 1 cup)  °½ medium banana  °17 small grapes (3 ounces)  °1 cup melon or berries  °½ cup canned or frozen fruit  °2 tablespoons dried fruit (blueberries, cherries, cranberries, raisins)  °½ cup unsweetened fruit juice  °Starchy Vegetables °½ cup cooked beans, peas, corn, potatoes/sweet potatoes  °¼ large baked potato (3 ounces)  °1 cup acorn or butternut squash  Snack Foods °3 to 6 crackers  °8 potato chips or 13 tortilla chips (¾ ounce to 1 ounce)  °3 cups popped popcorn  °Dairy °3/4 cup (6 ounces) nonfat plain yogurt, or yogurt with sugar-free sweetener  °1 cup milk  °1 cup plain rice, soy, coconut or flavored almond milk Sweets and Desserts °½ cup ice cream or frozen yogurt  °1 tablespoon jam, jelly, pancake syrup, table sugar, or honey  °2 tablespoons light pancake syrup  °1 inch square of frosted cake or 2 inch square of unfrosted  cake  °2 small cookies (2/3 ounce each) or ¼ large cookie  °Sometimes you’ll have to estimate carbohydrate amounts if you don’t know the exact recipe. One cup of mixed foods like soups can have 1 to 2 carbohydrate servings, while some casseroles might have 2 or more servings of carbohydrate. °Foods that have less than 20 calories in each serving can be counted as “free” foods. Count 1 cup raw vegetables, or ½ cup cooked non-starchy vegetables as “free” foods. If you eat 3 or more servings at one meal, then count them as 1 carbohydrate serving.  °Foods without Carbohydrates  °Not all foods contain carbohydrates. Meat, some dairy, fats, non-starchy vegetables, and many beverages don’t contain carbohydrate. So when you count carbohydrates, you can generally exclude chicken, pork, beef, fish, seafood, eggs, tofu, cheese, butter, sour cream, avocado, nuts, seeds, olives, mayonnaise, water, black coffee, unsweetened tea, and zero-calorie drinks. Vegetables with no or low carbohydrate include green beans, cauliflower, tomatoes, and onions. °How much carbohydrate should I eat at each meal?  °Carbohydrate counting can help you plan your meals and manage your weight. Following are some starting points for carbohydrate intake at each meal. Work with your registered dietitian nutritionist to find the best range that works for your blood glucose and weight.  ° To Lose Weight To Maintain Weight  °Women 2 - 3 carb servings 3 - 4 carb servings  °Men 3 - 4 carb servings 4 - 5 carb servings  °Checking your   blood glucose after meals will help you know if you need to adjust the timing, type, or number of carbohydrate servings in your meal plan. Achieve and keep a healthy body weight by balancing your food intake and physical activity. ° °Tips °How should I plan my meals?  °Plan for half the food on your plate to include non-starchy vegetables, like salad greens, broccoli, or carrots. Try to eat 3 to 5 servings of non-starchy vegetables  every day. Have a protein food at each meal. Protein foods include chicken, fish, meat, eggs, or beans (note that beans contain carbohydrate). These two food groups (non-starchy vegetables and proteins) are low in carbohydrate. If you fill up your plate with these foods, you will eat less carbohydrate but still fill up your stomach. Try to limit your carbohydrate portion to ¼ of the plate.  °What fats are healthiest to eat?  °Diabetes increases risk for heart disease. To help protect your heart, eat more healthy fats, such as olive oil, nuts, and avocado. Eat less saturated fats like butter, cream, and high-fat meats, like bacon and sausage. Avoid trans fats, which are in all foods that list “partially hydrogenated oil” as an ingredient. °What should I drink?  °Choose drinks that are not sweetened with sugar. The healthiest choices are water, carbonated or seltzer waters, and tea and coffee without added sugars.  °Sweet drinks will make your blood glucose go up very quickly. One serving of soda or energy drink is ½ cup. It is best to drink these beverages only if your blood glucose is low.  °Artificially sweetened, or diet drinks, typically do not increase your blood glucose if they have zero calories in them. Read labels of beverages, as some diet drinks do have carbohydrate and will raise your blood glucose. °Label Reading Tips °Read Nutrition Facts labels to find out how many grams of carbohydrate are in a food you want to eat. Don’t forget: sometimes serving sizes on the label aren’t the same as how much food you are going to eat, so you may need to calculate how much carbohydrate is in the food you are serving yourself.  ° °Carbohydrate Counting for People with Diabetes Sample 1-Day Menu  °Breakfast ¾ cup yogurt, low fat, low sugar (1 carbohydrate serving)  °½ cup cereal, ready-to-eat, unsweetened (1 carbohydrate serving)  °1 cup strawberries (1 carbohydrate serving)  °¼ cup almonds (½ carbohydrate serving)   °Lunch 1, 5 ounce can chunk light tuna  °2 ounces cheese, low fat cheddar  °6 whole wheat crackers (1 carbohydrate serving)  °1 small apple (1½ carbohydrate servings)  °½ cup carrots (½ carbohydrate serving)  °½ cup snap peas  °1 cup 1% milk (1 carbohydrate serving)   °Evening Meal Stir fry made with: 3 ounces chicken  °1 cup brown rice (3 carbohydrate servings)  °½ cup broccoli (½ carbohydrate serving)  °½ cup green beans  °¼ cup onions  °1 tablespoon olive oil  °2 tablespoons teriyaki sauce (½ carbohydrate serving)  °Evening Snack 1 extra small banana (1 carbohydrate serving)  °1 tablespoon peanut butter  ° °Carbohydrate Counting for People with Diabetes Vegan Sample 1-Day Menu  °Breakfast 1 cup cooked oatmeal (2 carbohydrate servings)  °½ cup blueberries (1 carbohydrate serving)  °2 tablespoons flaxseeds  °1 cup soymilk fortified with calcium and vitamin D  °1 cup coffee  °Lunch 2 slices whole wheat bread (2 carbohydrate servings)  °½ cup baked tofu  °¼ cup lettuce  °2 slices tomato  °2 slices avocado  °½   cup baby carrots (½ carbohydrate serving)  °1 orange (1 carbohydrate serving)  °1 cup soymilk fortified with calcium and vitamin D   °Evening Meal Burrito made with: 1 6-inch corn tortilla (1 carbohydrate serving)  °1 cup refried vegetarian beans (2 carbohydrate servings)  °¼ cup chopped tomatoes  °¼ cup lettuce  °¼ cup salsa  °1/3 cup brown rice (1 carbohydrate serving)  °1 tablespoon olive oil for rice  °½ cup zucchini   °Evening Snack 6 small whole grain crackers (1 carbohydrate serving)  °2 apricots (½ carbohydrate serving)  °¼ cup unsalted peanuts (½ carbohydrate serving)   ° °Carbohydrate Counting for People with Diabetes Vegetarian (Lacto-Ovo) Sample 1-Day Menu  °Breakfast 1 cup cooked oatmeal (2 carbohydrate servings)  °½ cup blueberries (1 carbohydrate serving)  °2 tablespoons flaxseeds  °1 egg  °1 cup 1% milk (1 carbohydrate serving)  °1 cup coffee  °Lunch 2 slices whole wheat bread (2 carbohydrate  servings)  °2 ounces low-fat cheese  °¼ cup lettuce  °2 slices tomato  °2 slices avocado  °½ cup baby carrots (½ carbohydrate serving)  °1 orange (1 carbohydrate serving)  °1 cup unsweetened tea  °Evening Meal Burrito made with: 1 6-inch corn tortilla (1 carbohydrate serving)  °½ cup refried vegetarian beans (1 carbohydrate serving)  °¼ cup tomatoes  °¼ cup lettuce  °¼ cup salsa  °1/3 cup brown rice (1 carbohydrate serving)  °1 tablespoon olive oil for rice  °½ cup zucchini  °1 cup 1% milk (1 carbohydrate serving)  °Evening Snack 6 small whole grain crackers (1 carbohydrate serving)  °2 apricots (½ carbohydrate serving)  °¼ cup unsalted peanuts (½ carbohydrate serving)   ° °Copyright 2020 © Academy [employer]. All rights reserved. ° °Using Nutrition Labels: Carbohydrate ° °Serving Size  °Look at the serving size. All the information on the label is based on this portion. °Servings Per Container  °The number of servings contained in the package. °Guidelines for Carbohydrate  °Look at the total grams of carbohydrate in the serving size.  °1 carbohydrate choice = 15 grams of carbohydrate. °Range of Carbohydrate Grams Per Choice  °Carbohydrate Grams/Choice Carbohydrate Choices  °6-10 ½  °11-20 1  °21-25 1½  °26-35 2  °36-40 2½  °41-50 3  °51-55 3½  °56-65 4  °66-70 4½  °71-80 5  ° ° °Copyright 2020 © Academy of [employer] and Dietetics. All rights reserved. ° °

## 2022-01-27 NOTE — Progress Notes (Signed)
Initial Nutrition Assessment ? ?DOCUMENTATION CODES:  ? ?Obesity unspecified ? ?INTERVENTION:  ?- provided diet education and handouts to patient. ?- will also put handout in [facility]. ? ? ?NUTRITION DIAGNOSIS:  ? ?Food and nutrition related knowledge deficit related to other (see comment) (new dx of DM) as evidenced by other (comment) (need for diet education). ? ?GOAL:  ? ?Patient will meet greater than or equal to 90% of their needs ? ?MONITOR:  ? ?PO intake, Labs, Weight trends ? ?REASON FOR ASSESSMENT:  ? ?Malnutrition Screening Tool, Consult ?Diet education ? ?ASSESSMENT:  ? ?61 y.o. male with medical history of liver cirrhosis associated with chronic hepatitis C (treated with Harvoni), alcohol abuse, HTN, HLD, gout, anemia, and OSA. He was admitted for DKA in the setting of newly dx DM. ? ?Patient laying in bed with visitor at bedside. He has been eating 80-100% at meals. Talked about using meal trays as a visual for carbohydrate servings at home. He shares that he has been hesitant to eat all of the carb-containing foods on his tray d/t concern that he will eat too much and further affect CBG. Able to talk through this and that he will not receive more than allowed and that other factors affect CBGs, not only food. ? ?Patient is knowledgeable about carbohydrate containing foods and foods that are low in carbs or do not contain carbs. ? ?Provided him with Carbohydrate Counting for People with Diabetes and Label Reading Tips for Diabetes handouts from the Academy of [employer] and thoroughly reviewed these handouts with him. ? ?All questions and concerns addressed. Patient aware to alert RN if further questions arise and RD will stop back. ? ?Teach back method used. Expect good compliance. ? ?Weight on 3/7 was 207 lb and weight on 07/22/21 was 218 lb. This indicates 11 lb weight loss (5% body weight) in the past 6 months.  ? ? ?Labs reviewed; CBGs: 309 and 359 mg/dl, Na: 295 mmol/l, Cl: 96  mmol/l. ? ?Medications reviewed; 325 mg ferrous sulfate/day, sliding scale novolog, 8 units novolog TID, 30 units semglee/day, 40 mg oral protonix/day. ? ?IVF; LR @ 100 ml/hr.  ?  ? ?NUTRITION - FOCUSED PHYSICAL EXAM: ? ?Flowsheet Row Most Recent Value  ?Orbital Region No depletion  ?Upper Arm Region No depletion  ?Thoracic and Lumbar Region No depletion  ?Buccal Region No depletion  ?Temple Region No depletion  ?Clavicle Bone Region No depletion  ?Clavicle and Acromion Bone Region No depletion  ?Scapular Bone Region No depletion  ?Dorsal Hand No depletion  ?Patellar Region No depletion  ?Anterior Thigh Region Unable to assess  ?Posterior Calf Region Unable to assess  ?Edema (RD Assessment) Unable to assess  ?Hair Reviewed  ?Eyes Reviewed  ?Mouth Reviewed  [missing several front teeth]  ?Skin Reviewed  ?Nails Reviewed  ? ?  ? ? ?Diet Order:   ?Diet Order   ? ?       ?  Diet Carb Modified Fluid consistency: Thin; Room service appropriate? Yes  Diet effective now       ?  ? ?  ?  ? ?  ? ? ?EDUCATION NEEDS:  ? ?Education needs have been addressed ? ?Skin:  Skin Assessment: Reviewed RN Assessment ? ?Last BM:  PTA/unknown ? ?Height:  ? ?Ht Readings from Last 1 Encounters:  ?01/25/22 5\' 8"  (1.727 m)  ? ? ?Weight:  ? ?Wt Readings from Last 1 Encounters:  ?01/25/22 93.9 kg  ? ? ? ?BMI:  Body mass  index is 31.47 kg/m?. ? ?Estimated Nutritional Needs:  ?Kcal:  2150-2350 kcal ?Protein:  105-120 grams ?Fluid:  >/= 2 L/day ? ? ? ? ?Trenton Gammon, MS, RD, LDN ?Inpatient Clinical Dietitian ?RD pager # available in [facility]  ?After hours/weekend pager # available in [facility] ? ?

## 2022-01-27 NOTE — Assessment & Plan Note (Addendum)
-- 

## 2022-01-27 NOTE — Progress Notes (Signed)
Pt refused CPAP qhs.  Pt encouraged to contact RT should he change his mind.   

## 2022-01-28 DIAGNOSIS — E1110 Type 2 diabetes mellitus with ketoacidosis without coma: Secondary | ICD-10-CM | POA: Diagnosis not present

## 2022-01-28 LAB — COMPREHENSIVE METABOLIC PANEL
ALT: 21 U/L (ref 0–44)
AST: 21 U/L (ref 15–41)
Albumin: 3.4 g/dL — ABNORMAL LOW (ref 3.5–5.0)
Alkaline Phosphatase: 79 U/L (ref 38–126)
Anion gap: 11 (ref 5–15)
BUN: 13 mg/dL (ref 8–23)
CO2: 23 mmol/L (ref 22–32)
Calcium: 9.3 mg/dL (ref 8.9–10.3)
Chloride: 97 mmol/L — ABNORMAL LOW (ref 98–111)
Creatinine, Ser: 0.91 mg/dL (ref 0.61–1.24)
GFR, Estimated: >60 mL/min (ref >60)
Glucose, Bld: 254 mg/dL — ABNORMAL HIGH (ref 70–99)
Potassium: 3.5 mmol/L (ref 3.5–5.1)
Sodium: 131 mmol/L — ABNORMAL LOW (ref 135–145)
Total Bilirubin: 0.9 mg/dL (ref 0.3–1.2)
Total Protein: 7.4 g/dL (ref 6.5–8.1)

## 2022-01-28 LAB — GLUCOSE, CAPILLARY
Glucose-Capillary: 237 mg/dL — ABNORMAL HIGH (ref 70–99)
Glucose-Capillary: 246 mg/dL — ABNORMAL HIGH (ref 70–99)
Glucose-Capillary: 267 mg/dL — ABNORMAL HIGH (ref 70–99)

## 2022-01-28 LAB — BETA-HYDROXYBUTYRIC ACID: Beta-Hydroxybutyric Acid: 1.23 mmol/L — ABNORMAL HIGH (ref 0.05–0.27)

## 2022-01-28 MED ORDER — INSULIN GLARGINE-YFGN 100 UNIT/ML ~~LOC~~ SOLN
24.0000 [IU] | Freq: Two times a day (BID) | SUBCUTANEOUS | Status: DC
  Filled 2022-01-28: qty 0.24

## 2022-01-28 MED ORDER — LANTUS SOLOSTAR 100 UNIT/ML ~~LOC~~ SOPN
24.0000 [IU] | PEN_INJECTOR | Freq: Two times a day (BID) | SUBCUTANEOUS | 0 refills | Status: AC
Start: 2022-01-28 — End: ?

## 2022-01-28 MED ORDER — BLOOD GLUCOSE METER KIT: PACK | 0 refills | Status: AC

## 2022-01-28 MED ORDER — INSULIN GLARGINE-YFGN 100 UNIT/ML ~~LOC~~ SOLN
4.0000 [IU] | SUBCUTANEOUS | Status: AC
  Administered 2022-01-28: 4 [IU] via SUBCUTANEOUS
  Filled 2022-01-28: qty 0.04

## 2022-01-28 MED ORDER — INSULIN PEN NEEDLE 32G X 4 MM MISC: 1.0000 | Freq: Four times a day (QID) | 0 refills | Status: AC

## 2022-01-28 MED ORDER — NOVOLOG FLEXPEN 100 UNIT/ML ~~LOC~~ SOPN: 10.0000 [IU] | PEN_INJECTOR | Freq: Three times a day (TID) | SUBCUTANEOUS | 0 refills | Status: AC

## 2022-01-28 NOTE — Discharge Summary (Signed)
Physician Discharge Summary   Patient: Lee Johnston MRN: [id number] DOB: 1960/11/22  Admit date:     01/25/2022  Discharge date: 01/28/22  Discharge Physician: Marylu Lund   PCP: Roselee Nova, MD   Recommendations at discharge:     Follow up with PCP in 1-2 weeks  Discharge Diagnoses: Principal Problem:   DKA (diabetic ketoacidosis) ([place]) Active Problems:   AKI (acute kidney injury) ([place])   Hyperkalemia   Hypertension   Hepatic cirrhosis ([place])   Hyponatremia  Resolved Problems:   * No resolved hospital problems. *  Hospital Course: Lee Johnston is a 61 y.o. male with medical history significant for liver cirrhosis associated with chronic hepatitis C (treated with Harvoni) and alcohol use, hypertension, hyperlipidemia, gout, anemia, OSA who is admitted with DKA in setting of newly diagnosed diabetes.  Assessment and Plan: * DKA (diabetic ketoacidosis) ([place]) Presenting with DKA in setting of newly diagnosed diabetes. -A1c 12.1 -anion gap closed with insulin gtt and IVF hydration -Transitioned to 24 units bid -with meal coverage increased to 10 units TID -Continue to titrate insulin with goal of euglycemia  AKI (acute kidney injury) ([place]) Secondary to DKA. Resolved with IVF   Hyperkalemia Potassium 6.1 in setting of DKA, may be affected by some level of hemolysis.   -resolved with IVF  Hypertension Continue amlodipine.   Held ACEI secondary to ARF. Given normalized renal function, would resume ACEI  Hyponatremia Secondary to hyperglycemia   Hepatic cirrhosis (HCC) Chronic and stable without evidence of decompensation or encephalopathy.  History of hepatitis C s/p Harvoni treatment.  Denies any alcohol use for the last 5 months.  He is following with [facility] and states he just had an MRI completed to assess for spot on his liver. -Follow-up with [facility] outpatient         Consultants:  Procedures performed:   Disposition:  Home Diet recommendation:  Carb modified diet DISCHARGE MEDICATION: Allergies as of 01/28/2022   No Known Allergies      Medication List     TAKE these medications    allopurinol 300 MG tablet Commonly known as: ZYLOPRIM Take 300 mg by mouth at bedtime.   amLODipine 10 MG tablet Commonly known as: NORVASC Take 10 mg by mouth daily.   atorvastatin 20 MG tablet Commonly known as: LIPITOR Take 20 mg by mouth at bedtime.   blood glucose meter kit and supplies Dispense based on patient and insurance preference. Use up to four times daily as directed. (FOR ICD-10 E10.9, E11.9).   ferrous sulfate 325 (65 FE) MG tablet Take 325 mg by mouth at bedtime.   gabapentin 300 MG capsule Commonly known as: NEURONTIN Take 300 mg by mouth at bedtime.   ibuprofen 200 MG tablet Commonly known as: ADVIL Take 200 mg by mouth daily as needed (for pain).   Insulin Pen Needle 32G X 4 MM Misc 1 Device by Does not apply route QID.   Lantus SoloStar 100 UNIT/ML Solostar Pen Generic drug: insulin glargine Inject 24 Units into the skin 2 (two) times daily.   lisinopril 40 MG tablet Commonly known as: ZESTRIL Take 40 mg by mouth daily.   loratadine 10 MG tablet Commonly known as: CLARITIN Take 10 mg by mouth daily as needed for allergies.   NovoLOG FlexPen 100 UNIT/ML FlexPen Generic drug: insulin aspart Inject 10 Units into the skin 3 (three) times daily with meals.   pantoprazole 40 MG tablet Commonly known as: PROTONIX Take  40 mg by mouth every evening.        Follow-up Information     Roselee Nova, MD Follow up in 2 week(s).   Specialty: Family Medicine Why: Hospital follow up Contact information: [place] [address] [phone]                Discharge Exam: Danley Danker Weights   01/25/22 1650 01/28/22 0323  Weight: 93.9 kg 97.8 kg   General exam: Awake, laying in bed, in nad Respiratory system: Normal respiratory effort, no  wheezing Cardiovascular system: regular rate, s1, s2 Gastrointestinal system: Soft, nondistended, positive BS Central nervous system: CN2-12 grossly intact, strength intact Extremities: Perfused, no clubbing Skin: Normal skin turgor, no notable skin lesions seen Psychiatry: Mood normal // no visual hallucinations    Condition at discharge: good  The results of significant diagnostics from this hospitalization (including imaging, microbiology, ancillary and laboratory) are listed below for reference.   Imaging Studies: No results found.  Microbiology: Results for orders placed or performed during the hospital encounter of 01/25/22  Resp Panel by RT-PCR (Flu A&B, Covid) Nasopharyngeal Swab     Status: None   Collection Time: 01/25/22  6:17 PM   Specimen: Nasopharyngeal Swab; Nasopharyngeal(NP) swabs in vial transport medium  Result Value Ref Range Status   SARS Coronavirus 2 by RT PCR NEGATIVE NEGATIVE Final    Comment: (NOTE) SARS-CoV-2 target nucleic acids are NOT DETECTED.  The SARS-CoV-2 RNA is generally detectable in upper respiratory specimens during the acute phase of infection. The lowest concentration of SARS-CoV-2 viral copies this assay can detect is 138 copies/mL. A negative result does not preclude SARS-Cov-2 infection and should not be used as the sole basis for treatment or other patient management decisions. A negative result may occur with  improper specimen collection/handling, submission of specimen other than nasopharyngeal swab, presence of viral mutation(s) within the areas targeted by this assay, and inadequate number of viral copies(<138 copies/mL). A negative result must be combined with clinical observations, patient history, and epidemiological information. The expected result is Negative.  Fact Sheet for Patients:  EntrepreneurPulse.com.au  Fact Sheet for Healthcare Providers:  IncredibleEmployment.be  This test  is no t yet approved or cleared by the [place] FDA and  has been authorized for detection and/or diagnosis of SARS-CoV-2 by FDA under an Emergency Use Authorization (EUA). This EUA will remain  in effect (meaning this test can be used) for the duration of the COVID-19 declaration under Section 564(b)(1) of the Act, 21 U.S.C.section 360bbb-3(b)(1), unless the authorization is terminated  or revoked sooner.       Influenza A by PCR NEGATIVE NEGATIVE Final   Influenza B by PCR NEGATIVE NEGATIVE Final    Comment: (NOTE) The Xpert Xpress SARS-CoV-2/FLU/RSV plus assay is intended as an aid in the diagnosis of influenza from Nasopharyngeal swab specimens and should not be used as a sole basis for treatment. Nasal washings and aspirates are unacceptable for Xpert Xpress SARS-CoV-2/FLU/RSV testing.  Fact Sheet for Patients: EntrepreneurPulse.com.au  Fact Sheet for Healthcare Providers: IncredibleEmployment.be  This test is not yet approved or cleared by the [place] FDA and has been authorized for detection and/or diagnosis of SARS-CoV-2 by FDA under an Emergency Use Authorization (EUA). This EUA will remain in effect (meaning this test can be used) for the duration of the COVID-19 declaration under Section 564(b)(1) of the Act, 21 U.S.C. section 360bbb-3(b)(1), unless the authorization is terminated or revoked.  Performed at [facility], [place] [address], [place], [place] [address]   MRSA Next Gen by PCR, Nasal     Status: None   Collection Time: 01/26/22  2:13 AM   Specimen: Nasal Mucosa; Nasal Swab  Result Value Ref Range Status   MRSA by PCR Next Gen NOT DETECTED NOT DETECTED Final    Comment: (NOTE) The GeneXpert MRSA Assay (FDA approved for NASAL specimens only), is one component of a comprehensive MRSA colonization surveillance program. It is not intended to diagnose MRSA infection nor to guide or monitor  treatment for MRSA infections. Test performance is not FDA approved in patients less than 53 years old. Performed at [facility], [place] [address] [place], [place] [address]     Labs: CBC: Recent Labs  Lab 01/25/22 1707 01/25/22 2132 01/26/22 0258  WBC 7.4  --  7.3  NEUTROABS 3.9  --   --   HGB 11.8* 11.9* 11.4*  HCT 32.5* 35.0* 31.4*  MCV 79.3*  --  77.7*  PLT 209  --  211   Basic Metabolic Panel: Recent Labs  Lab 01/26/22 1137 01/26/22 1537 01/27/22 0228 01/27/22 1309 01/28/22 0237  NA 134* 130* 130* 129* 131*  K 4.0 4.0 3.6 3.6 3.5  CL 97* 96* 97* 96* 97*  CO2 _0 GLUCOSE 147* 355* 281* 310* 254*  BUN _1 CREATININE 0.82 0.98 0.96 0.82 0.91  CALCIUM 9.8 9.3 9.7 9.2 9.3   Liver Function Tests: Recent Labs  Lab 01/27/22 0228 01/28/22 0237  AST 27 21  ALT 26 21  ALKPHOS 78 79  BILITOT 0.5 0.9  PROT 7.9 7.4  ALBUMIN 3.7 3.4*   CBG: Recent Labs  Lab 01/27/22 1130 01/27/22 1555 01/27/22 2113 01/28/22 0816 01/28/22 1133  GLUCAP 359* 320* 234* 237* 246*    Discharge time spent: less than 30 minutes.  Signed: Marylu Lund, MD [facility] 01/28/2022

## 2022-01-28 NOTE — Plan of Care (Signed)
?  Problem: Education: ?Goal: Knowledge of General Education information will improve ?Description: Including pain rating scale, medication(s)/side effects and non-pharmacologic comfort measures ?01/28/2022 1605 by Ronnette Juniper, RN ?Outcome: Adequate for Discharge ?01/28/2022 1604 by Ronnette Juniper, RN ?Outcome: Adequate for Discharge ?  ?Problem: Health Behavior/Discharge Planning: ?Goal: Ability to manage health-related needs will improve ?01/28/2022 1605 by Ronnette Juniper, RN ?Outcome: Adequate for Discharge ?01/28/2022 1604 by Ronnette Juniper, RN ?Outcome: Adequate for Discharge ?  ?Problem: Clinical Measurements: ?Goal: Ability to maintain clinical measurements within normal limits will improve ?01/28/2022 1605 by Ronnette Juniper, RN ?Outcome: Adequate for Discharge ?01/28/2022 1604 by Ronnette Juniper, RN ?Outcome: Adequate for Discharge ?Goal: Will remain free from infection ?01/28/2022 1605 by Ronnette Juniper, RN ?Outcome: Adequate for Discharge ?01/28/2022 1604 by Ronnette Juniper, RN ?Outcome: Adequate for Discharge ?Goal: Diagnostic test results will improve ?01/28/2022 1605 by Ronnette Juniper, RN ?Outcome: Adequate for Discharge ?01/28/2022 1604 by Ronnette Juniper, RN ?Outcome: Adequate for Discharge ?Goal: Respiratory complications will improve ?01/28/2022 1605 by Ronnette Juniper, RN ?Outcome: Adequate for Discharge ?01/28/2022 1604 by Ronnette Juniper, RN ?Outcome: Adequate for Discharge ?Goal: Cardiovascular complication will be avoided ?01/28/2022 1605 by Ronnette Juniper, RN ?Outcome: Adequate for Discharge ?01/28/2022 1604 by Ronnette Juniper, RN ?Outcome: Adequate for Discharge ?  ?Problem: Activity: ?Goal: Risk for activity intolerance will decrease ?01/28/2022 1605 by Ronnette Juniper, RN ?Outcome: Adequate for Discharge ?01/28/2022 1604 by Ronnette Juniper, RN ?Outcome: Adequate for Discharge ?  ?Problem: Nutrition: ?Goal: Adequate nutrition will be maintained ?01/28/2022 1605 by Ronnette Juniper, RN ?Outcome: Adequate for  Discharge ?01/28/2022 1604 by Ronnette Juniper, RN ?Outcome: Adequate for Discharge ?  ?Problem: Coping: ?Goal: Level of anxiety will decrease ?01/28/2022 1605 by Ronnette Juniper, RN ?Outcome: Adequate for Discharge ?01/28/2022 1604 by Ronnette Juniper, RN ?Outcome: Adequate for Discharge ?  ?Problem: Elimination: ?Goal: Will not experience complications related to bowel motility ?01/28/2022 1605 by Ronnette Juniper, RN ?Outcome: Adequate for Discharge ?01/28/2022 1604 by Ronnette Juniper, RN ?Outcome: Adequate for Discharge ?Goal: Will not experience complications related to urinary retention ?01/28/2022 1605 by Ronnette Juniper, RN ?Outcome: Adequate for Discharge ?01/28/2022 1604 by Ronnette Juniper, RN ?Outcome: Adequate for Discharge ?  ?Problem: Pain Managment: ?Goal: General experience of comfort will improve ?01/28/2022 1605 by Ronnette Juniper, RN ?Outcome: Adequate for Discharge ?01/28/2022 1604 by Ronnette Juniper, RN ?Outcome: Adequate for Discharge ?  ?Problem: Safety: ?Goal: Ability to remain free from injury will improve ?01/28/2022 1605 by Ronnette Juniper, RN ?Outcome: Adequate for Discharge ?01/28/2022 1604 by Ronnette Juniper, RN ?Outcome: Adequate for Discharge ?  ?Problem: Skin Integrity: ?Goal: Risk for impaired skin integrity will decrease ?01/28/2022 1605 by Ronnette Juniper, RN ?Outcome: Adequate for Discharge ?01/28/2022 1604 by Ronnette Juniper, RN ?Outcome: Adequate for Discharge ?  ?

## 2022-01-28 NOTE — Progress Notes (Addendum)
Inpatient Diabetes Program Recommendations ? ?AACE/ADA: New Consensus Statement on Inpatient Glycemic Control (2015) ? ?Target Ranges:  Prepandial:   less than 140 mg/dL ?     Peak postprandial:   less than 180 mg/dL (1-2 hours) ?     Critically ill patients:  140 - 180 mg/dL  ? ?Lab Results  ?Component Value Date  ? GLUCAP 246 (H) 01/28/2022  ? HGBA1C 12.1 (H) 01/26/2022  ? ? ?Review of Glycemic Control ? Latest Reference Range & Units 01/28/22 08:16 01/28/22 11:33  ?Glucose-Capillary 70 - 99 mg/dL 678 (H) 938 (H)  ?(H): Data is abnormally high ? ?Diabetes history: New DM2 ?  ?Current orders for Inpatient glycemic control: Semglee 20 units BID, Novolog 0-15 units TID and 0-5 units QHS, Novolog 10 units TID ?Inpatient Diabetes Program Recommendations:   ? ?Semglee 24 units BID ? ?Addendum@1400 : ? ?Reviewed education on DM2, insulin pen, hypoglycemia and diet today.  Verbal order from Dr. Rhona Leavens allowing me to place a Freestyle Libre 2.  Place CGM on left arm.  Educated him on how to use, apply and remove.  Alarms set for low--70 mg/dL and high at 101 mg/dL.  May increase high alarm until his glucose has time to come down as he is consisently in the 200's; don't want it to bother him too much.   ? ?Reviewed hypoglycemia, signs, symptoms and treatments.  [facility]  [facility] if glucose is consistently < 100 mg/dL.   ? ?Will continue to follow while inpatient. ? ?Thank you, ?Dulce Sellar, MSN, RN ?Diabetes Coordinator ?Inpatient Diabetes Program ?[phone] (team pager from 8a-5p) ? ? ? ?

## 2022-01-29 ENCOUNTER — Other Ambulatory Visit: Payer: Self-pay

## 2022-01-29 ENCOUNTER — Encounter (HOSPITAL_COMMUNITY): Payer: Self-pay

## 2022-01-29 ENCOUNTER — Ambulatory Visit (HOSPITAL_COMMUNITY)
Admission: EM | Admit: 2022-01-29 | Discharge: 2022-01-29 | Disposition: A | Discharge status: Home / Self Care | Payer: Medicare HMO | Attending: Family Medicine | Admitting: Family Medicine

## 2022-01-29 DIAGNOSIS — Z794 Long term (current) use of insulin: Secondary | ICD-10-CM

## 2022-01-29 DIAGNOSIS — E1165 Type 2 diabetes mellitus with hyperglycemia: Secondary | ICD-10-CM

## 2022-01-29 DIAGNOSIS — R739 Hyperglycemia, unspecified: Secondary | ICD-10-CM

## 2022-01-29 LAB — CBG MONITORING, ED: Glucose-Capillary: 324 mg/dL — ABNORMAL HIGH (ref 70–99)

## 2022-01-29 NOTE — ED Notes (Signed)
AVS  from DC from [facility] on 01-28-22  reprinted and given to PT. ?

## 2022-01-29 NOTE — ED Triage Notes (Signed)
Pt reports being discharged from the hospital last night and states he wants to know if he is taking his medications correctly.  ?

## 2022-01-31 NOTE — ED Provider Notes (Signed)
?[facility] ? ? ?[id number] ?01/29/22 Arrival Time: 1436 ? ?ASSESSMENT & PLAN: ? ?1. Hyperglycemia   ? ?Instructed on proper way to use insulin pens; was significanlty under-dosing himself. Feels well. ?D/C to home. ? ?No change in medications. ? ?Labs Reviewed  ?CBG MONITORING, ED - Abnormal; Notable for the following components:  ?    Result Value  ? Glucose-Capillary 324 (*)   ? All other components within normal limits  ? ? ? Follow-up Information   ? ? Ellyn Hack, MD.   ?Specialty: Family Medicine ?Why: As needed. ?Contact information: ?[address] ?[place] [address] ?[phone] ? ? ?  ?  ? ?  ?  ? ?  ? ? ?Reviewed expectations re: course of current medical issues. Questions answered. ?Outlined signs and symptoms indicating need for more acute intervention. ?Understanding verbalized. ?After Visit Summary given. ? ? ?SUBJECTIVE: ?History from: Patient. ?Lee Johnston is a 61 y.o. male who was discharged from hospital yesterday s/p new onset DM with DKA. He is not sure he is using his insulin pens correctly. Blood sugars >400 at home today. ?Normal PO intake without n/v/d. No abd pain. Ambulatory. ? ?OBJECTIVE: ? ?Vitals:  ? 01/29/22 1526  ?BP: 129/89  ?Pulse: 81  ?Resp: 17  ?Temp: 98.4 ?F (36.9 ?C)  ?TempSrc: Oral  ?SpO2: 98%  ?  ?General appearance: alert; no distress ?Eyes: PERRLA; EOMI; conjunctiva normal ?Oropharynx: moist ?Lungs: speaks full sentences without difficulty; unlabored ?Abd: benign ?Extremities: no edema ?Skin: warm and dry ?Neurologic: normal gait ?Psychological: alert and cooperative; normal mood and affect ? ?Labs: ? ?Labs Reviewed  ?CBG MONITORING, ED - Abnormal; Notable for the following components:  ?    Result Value  ? Glucose-Capillary 324 (*)   ? All other components within normal limits  ? ? ?No Known Allergies ? ?Past Medical History:  ?Diagnosis Date  ? Alcohol abuse   ? Arthritis   ? WRIST  ? Cirrhosis (HCC)   ? Gout   ? HBP (high blood pressure)   ?  Hepatitis   ? Hypertension   ? ?Social History  ? ?Socioeconomic History  ? Marital status: Single  ?  Spouse name: Not on file  ? Number of children: 2  ? Years of education: Not on file  ? Highest education level: Not on file  ?Occupational History  ? Occupation: Metallurgist  ?  Employer: [employer]  ?Tobacco Use  ? Smoking status: Former  ?  Packs/day: 0.25  ?  Types: Cigarettes  ? Smokeless tobacco: Never  ? Tobacco comments:  ?  cutting back  ?Substance and Sexual Activity  ? Alcohol use: Yes  ?  Alcohol/week: 0.0 standard drinks  ?  Comment: states drinks wine and beer everyday; liquor occasional, last dringk 07/31/2015  ? Drug use: Not Currently  ?  Types: Cocaine  ?  Comment: patient states used crack cocaine 7 days ago, not for several months  ? Sexual activity: Not Currently  ?  Partners: Female  ?  Comment: given condoms  ?Other Topics Concern  ? Not on file  ?Social History Narrative  ? Not on file  ? ?Social Determinants of Health  ? ?Financial Resource Strain: Not on file  ?Food Insecurity: Not on file  ?Transportation Needs: Not on file  ?Physical Activity: Not on file  ?Stress: Not on file  ?Social Connections: Not on file  ?Intimate Partner Violence: Not on file  ? ?History reviewed. No pertinent  family history. ?Past Surgical History:  ?Procedure Laterality Date  ? CERVICAL FUSION Bilateral   ? COLONOSCOPY    ? POLYPECTOMY    ? ?  ?Mardella Layman, MD ?01/31/22 0920 ? ?

## 2022-02-23 ENCOUNTER — Other Ambulatory Visit: Payer: Self-pay | Admitting: Family Medicine

## 2022-03-01 ENCOUNTER — Other Ambulatory Visit: Payer: Self-pay | Admitting: Family Medicine

## 2022-03-01 ENCOUNTER — Other Ambulatory Visit (HOSPITAL_COMMUNITY): Payer: Self-pay | Admitting: Family Medicine

## 2022-03-01 DIAGNOSIS — R935 Abnormal findings on diagnostic imaging of other abdominal regions, including retroperitoneum: Secondary | ICD-10-CM

## 2022-07-08 ENCOUNTER — Telehealth: Payer: Self-pay

## 2022-07-08 NOTE — Telephone Encounter (Signed)
Spoke with patient and scheduled a telephonic Palliative Consult for 07/14/22 @ 2:30 PM with Clearnce Sorrel NP.  Consent obtained; updated Netsmart, Team List and Epic.

## 2022-07-14 ENCOUNTER — Other Ambulatory Visit: Payer: Medicare HMO | Admitting: Primary Care

## 2022-07-14 DIAGNOSIS — Z515 Encounter for palliative care: Secondary | ICD-10-CM

## 2022-07-14 DIAGNOSIS — B182 Chronic viral hepatitis C: Secondary | ICD-10-CM

## 2022-07-14 NOTE — Progress Notes (Signed)
    Therapist, nutritional Palliative Care Consult Note Telephone: [phone]  Fax: [phone]    Date of encounter: 07/14/22 PATIENT NAME: Lee Johnston [address] [place] [address]   [phone] (home) [phone] (work) DOB: 06-30-1961 MRN: [id number] PRIMARY CARE PROVIDER:    Ellyn Hack, MD,  [address] [place] [address] [phone]  REFERRING PROVIDER:   Dr. Gasper Lloyd    RESPONSIBLE PARTY:    Contact Information     Name Relation Home Work Mobile   Orourke,Rose Mother [phone]     Hettich,Canji Daughter   [phone]       Due to the COVID-19 crisis, this visit was done via telemedicine from my office and it was initiated and consent by this patient and or family.  I connected with  Lee Johnston OR PROXY on 07/14/22 by a audio enabled telemedicine application and verified that I am speaking with the correct person using two identifiers.   I discussed the limitations of evaluation and management by telemedicine. The patient expressed understanding and agreed to proceed. Palliative Care was asked to follow this patient by consultation request of   Dr. Gasper Lloyd  to address advance care planning.                                   ASSESSMENT AND PLAN / RECOMMENDATIONS:   Advance Care Planning/Goals of Care: Goals include to maximize quality of life and symptom management. Patient/health care surrogate gave his/her permission to discuss.Our advance care planning conversation included a discussion about:    The value and importance of advance care planning  Exploration of personal, cultural or spiritual beliefs that might influence medical decisions  Exploration of goals of care in the event of a sudden injury or illness  Identification of a healthcare agent -Daughter and girlfriend Review and updating or creation of an advance directive document CODE STATUS: FULL at this time. Endorses orthostatics,  is on BP medicine, has had some weight loss (20 lbs)  and may need antihypertensives reduced.F/u with PCP. Endorses wanting to have a DNR or MOST form- will have PC nurse and SW to visit as soon as they can to discuss.  Endorses intake is good and takes insulin. Takes more for high carb meal. Lives with mother and girlfriend, daughter and friends nearby who are supportive. Will be gone Labor day weekend, and for birthday, would like visit after that. Primary Dr Erskine Squibb  at [facility]    Follow up Palliative Care Visit: Palliative care will continue to follow for complex medical decision making, advance care planning, and clarification of goals. Return 2-4 weeks or prn.  I spent 20 minutes providing this consultation. More than 50% of the time in this consultation was spent in counseling and care coordination.  Thank you for the opportunity to participate in the care of [name].  The palliative care team will continue to follow. Please call our office at [phone] if we can be of additional assistance.   Eliezer Lofts DNP, MPH, AGPCNP-BC, ACHPN   COVID-19 PATIENT SCREENING TOOL Asked and negative response unless otherwise noted:   Have you had symptoms of covid, tested positive or been in contact with someone with symptoms/positive test in the past 5-10 days?

## 2022-08-04 ENCOUNTER — Telehealth: Payer: Self-pay

## 2022-08-04 NOTE — Telephone Encounter (Signed)
(  2:45 pm) PC SW scheduled a follow-up visit with patient. Patient to see RN/SW on 08/10/22 @ 12:30 pm. Patient report increased  pain and burning to his side and lower back.

## 2022-08-04 NOTE — Telephone Encounter (Signed)
PC SW connected with patient, returning his TC.  Patient shared that he was provided provided by a hospice SW to assist with funeral/burial arrangements but needed assistance in completing the forms. Patient also shared that he has been anticipating a PC visit by the team. SW made palliative admin and team aware of patients needs.

## 2022-08-08 ENCOUNTER — Other Ambulatory Visit: Payer: Medicare HMO | Admitting: *Deleted

## 2022-08-08 DIAGNOSIS — Z515 Encounter for palliative care: Secondary | ICD-10-CM

## 2022-08-10 ENCOUNTER — Other Ambulatory Visit: Payer: Medicare HMO

## 2022-08-10 ENCOUNTER — Other Ambulatory Visit: Payer: Medicare HMO | Admitting: *Deleted

## 2022-08-10 DIAGNOSIS — Z515 Encounter for palliative care: Secondary | ICD-10-CM

## 2022-08-11 ENCOUNTER — Telehealth: Payer: Self-pay | Admitting: *Deleted

## 2022-08-11 NOTE — Progress Notes (Signed)
COMMUNITY PALLIATIVE CARE SW NOTE  PATIENT NAME: Lee Johnston DOB: [birth year] MRN: [id number]  PRIMARY CARE PROVIDER: Roselee Nova, MD  RESPONSIBLE PARTY:  Acct ID - Guarantor Home Phone Work Phone Relationship Acct Type  [id number] - Schul,[id number] [phone] Self P/F     [address] [place], [place] [address]   SOCIAL WORK VISIT  PC SW completed an initial visit with patient at his home. He was present with his girlfriend and mother. He was alert and oriented to self. Patient and his family provided an update on his status. Patient reported that he he had a doctor's visit last week(9-12) and he has lost a great deal of weight. He was 233 lbs in July, he is now 213 lbs. He report that his appetite has decreased. Some days he does have an appetite, the days that he does eat, he eats at least 50% of meals. Due his decreased intake his insulin was cut down from 44 to 35 1x a day and 10 units to 8 units. He is having swelling in his thighs and abdomen that is affecting his mobility. He also has difficulty standing from the sitting position where he finds himself rocking forward to stand up. He is having increased pain around his entire waist area that he describes as "cutting him in two". The over-the-counter medication is no longer managing this. Patient was driving a few months ago, he is no longer driving as much. His girlfriend now drives him to and from appointments. Patient having some irritability and forgetfulness. Patient desires pain to manage better. SW provided education regarding palliative care program and he provided verbal consent to services. However, when educating him on difference between hospice and palliative care, patient desires a hospice consult due to persistent decline. Patient's goal is remain in his home as long as possible, but he would like to be transferred to an [place] for end of life.  SW provided education to him regarding HCPOA and MOST form. SW also  assisted him with completing the documents. He will take HCPOA to a notary to complete. SW will forward MOST form to palliative care NP, who will follow-up with patient to complete.  No other concerns noted. SW consulted with palliative RN, who will discuss hospice consult with patient's PCP for order.    CODE STATUS: DNR ADVANCED DIRECTIVES: Desires to complete MOST FORM COMPLETE:  Desires to complete HOSPICE EDUCATION PROVIDED: Yes  Duration of visit and documentation: 60 minutes  Payam Gribble, LCSW

## 2022-08-11 NOTE — Telephone Encounter (Signed)
Called patient to let him know where I was in the process of obtaining hospice consult order. He is Patent attorney.

## 2022-08-11 NOTE — Telephone Encounter (Signed)
LATE ENTRY from 08/10/22  SW visited with patient today and he is now starting to experience a decline in his status. He is having more pain in his legs and abdomen, having difficulty standing, decreased appetite and eating 50% less over the past month, 20 lb wt loss in past 2 months and needing more assistance with housework chores. Patient is agreeable to another hospice consult.  I spoke with Dr. Lyman Speller with [facility] as patient was previously evaluated by hospice in July 2023 and August 2023, however he had a PPS score of 100% and not exhibiting symptoms from his hepatocellular carcinoma. I gave Dr. Lyman Speller the assessment and he feels patient is now eligible for hospice care and agrees with getting another order for a hospice consult.  Called patient's PCP, Dr. Manuella Ghazi with [facility] and spoke with receptionist requesting an order for a hospice consult and provided reasons. She advised she will send the message to his care team and they will call me back. Left my contact information for a return call.

## 2022-08-11 NOTE — Telephone Encounter (Signed)
Called PCPs office again today, as I had not heard back regarding an order for a hospice consult I requested yesterday. Spoke with receptionist and according to her notes, the teams response was that patient requested to go with the [place]. I am assuming they are meaning since he has been seen there most recently that this is who I would need to contact for an order. Receptionist unable to clarify this.  I called and spoke with Jasma at [facility] to explain patient's decline and request an order for a hospice consult. She advised that she has sent this request to his provider and team and they will contact me within 24 hours. Left my contact information for return call.

## 2022-08-15 ENCOUNTER — Telehealth: Payer: Self-pay | Admitting: *Deleted

## 2022-08-15 NOTE — Telephone Encounter (Signed)
Received a call from [place] with Dr. Marshell Levan office at the [facility] in regards to a request for a hospice consult I requested last week. Kim advised that the previous hospice order that was give to [facility] is still valid and it is ok to proceed with another hospice consult.  Hospice consult order sent to our [facility] referral center who will reach out to patient to schedule date/time of evaluation visit.

## 2022-08-16 NOTE — Progress Notes (Signed)
[facility] COMMUNITY PALLIATIVE CARE RN NOTE  PATIENT NAME: Lee Johnston DOB: 01-27-1961 MRN: [id number]  PRIMARY CARE PROVIDER: Roselee Nova, MD  RESPONSIBLE PARTY: Imagene Sheller (mother) Acct ID - Guarantor Home Phone Work Phone Relationship Acct Type  [id number] - Durkin,[id number] [phone] Self P/F     [address], [place], [place] [address]   RN telephonic encounter completed with patient. He reports that he is having increased abdominal pain that wraps around him like a band radiating into his back. Reports bilateral thigh pain from swelling. He is also noticing some shortness of breath with exertion. He states he was told by Oncology that this is most likely a side effect of his cancer. He remains ambulatory but is having some difficulty standing. Has to rock back and forth. His significant other is now driving him to appointments and doing all housework. He is independent with ADLs. His appetite has decreased to 50% less than what he used to eat when he feels like eating. He has lost 20 lbs in the past 2 months. Current weight 213 lbs. RN/SW team have a visit scheduled on 08/13/22 to discuss transitioning to hospice care.   Daryl Eastern, RN BSN

## 2023-01-20 DEATH — deceased

## 2023-03-14 IMAGING — CR DG ABDOMEN 1V
2 series · 2 of 2 positions shown · non-contrast
Comparison: None.

CLINICAL DATA: Right flank pain.

EXAM:
ABDOMEN - 1 VIEW

[t abdomen supine (1 of 2)]
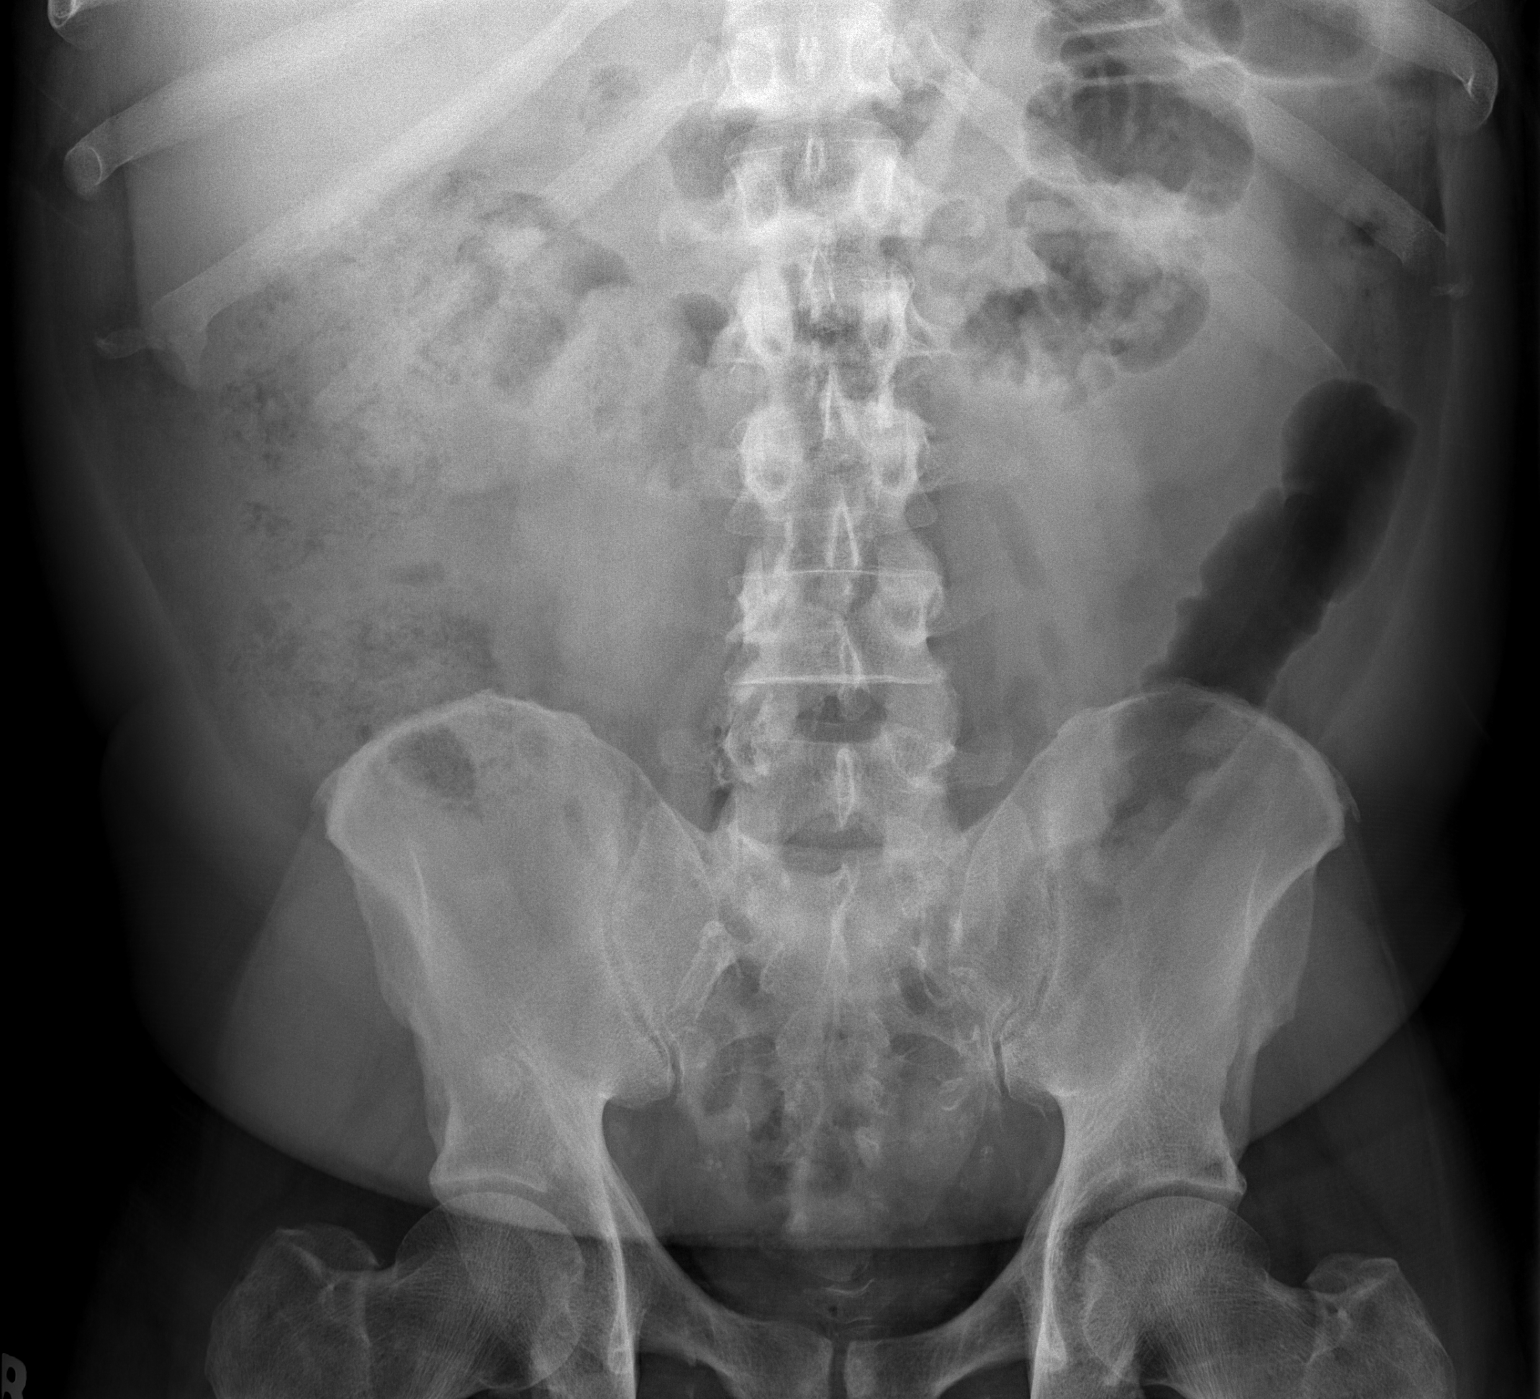

[t abdomen supine (2 of 2)]
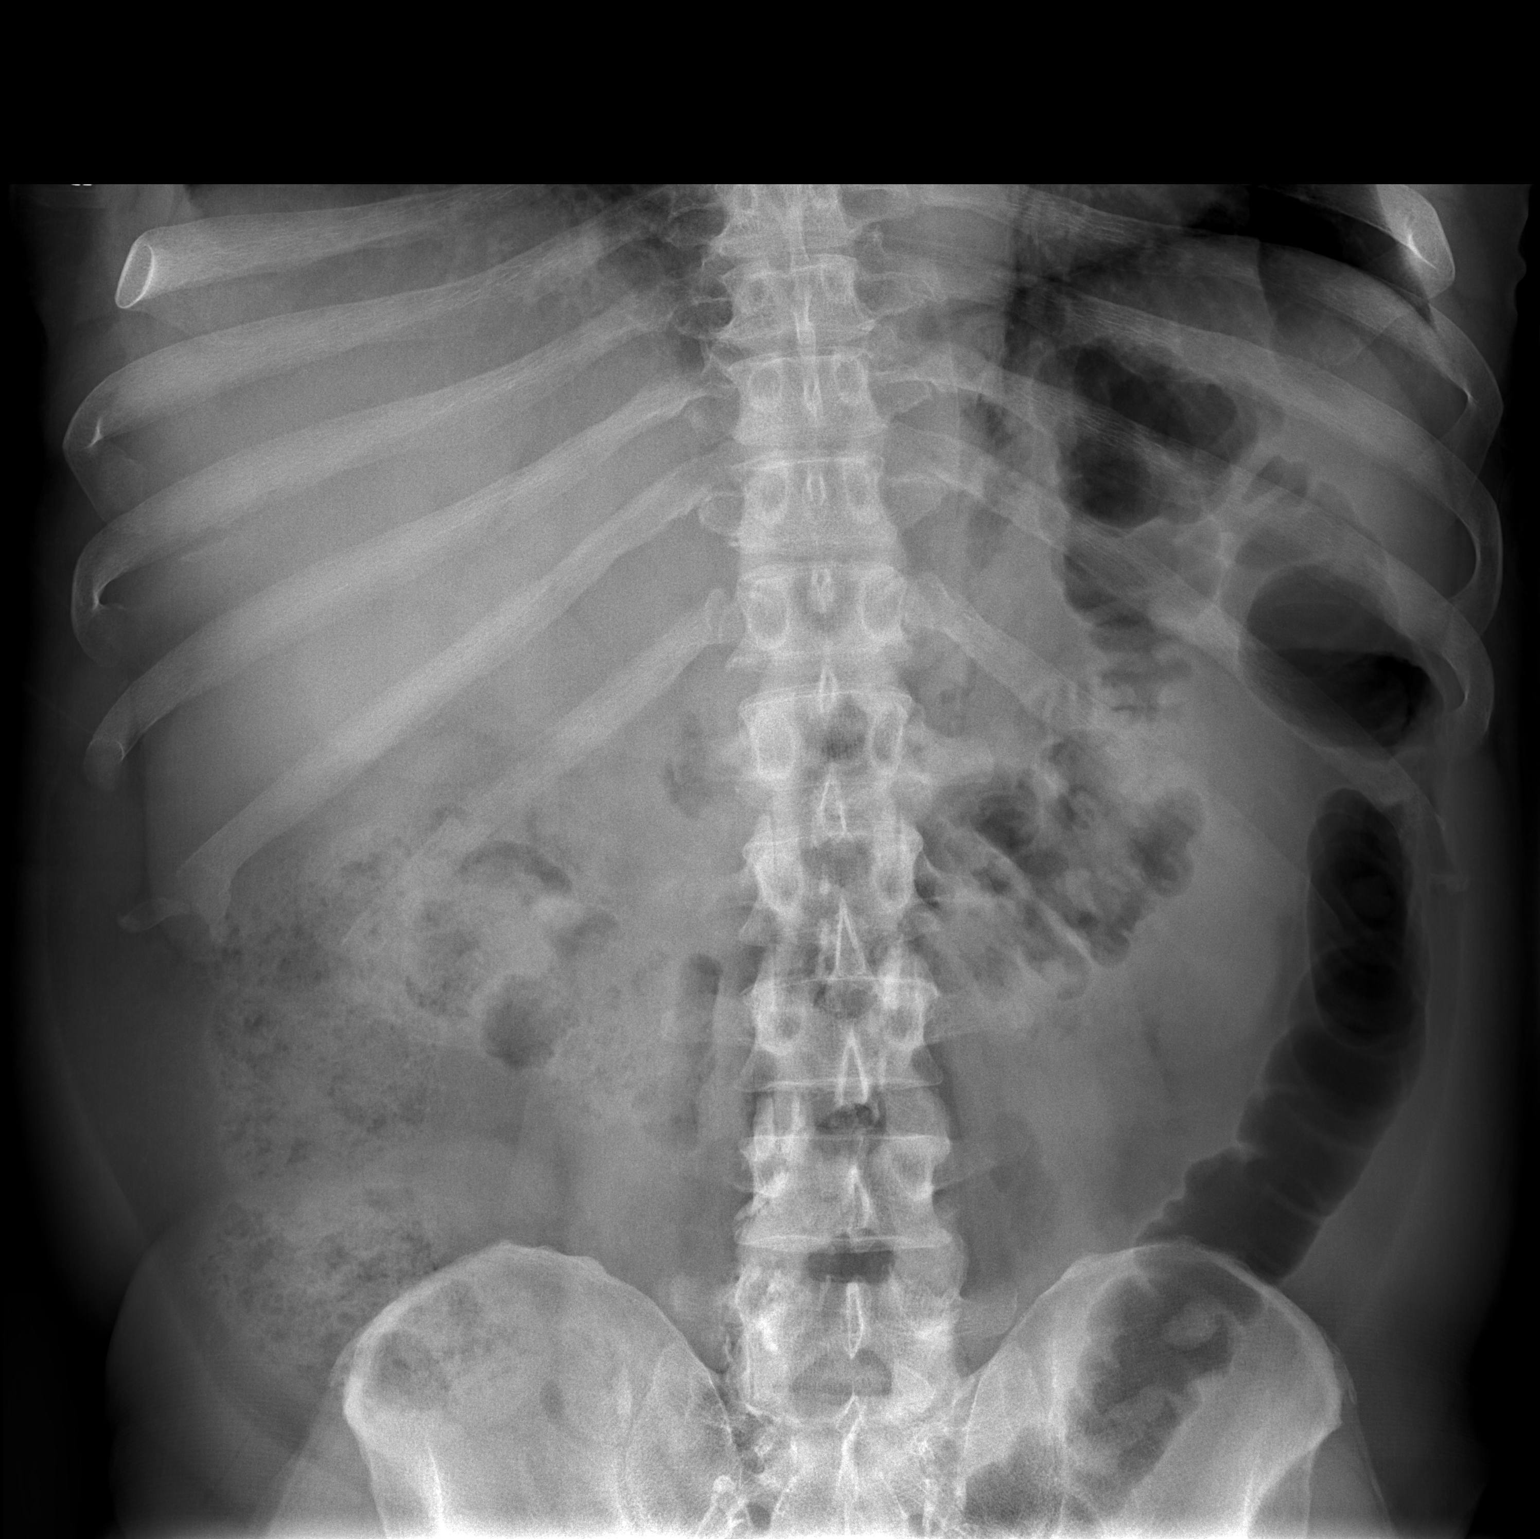

[2 of 2 positions shown; findings below may reference images not displayed]

FINDINGS: Mild fecal loading in the ascending and proximal transverse colon.

Both kidneys are largely obscured by bowel contents. No renal stones
identified. Multiple calcifications are seen in the pelvis.

An oval density projects just to the right of the right SI joint
superiorly measuring up to 12 mm in cranial caudal dimension.

No other abnormalities.
IMPRESSION: 1. Mild fecal loading in the ascending and proximal transverse
colon.
2. Both kidneys are largely obscured by bowel contents but no stones
are noted.
3. An oval density projects to the right of the superior right SI
joint measuring 12 mm. This could represent a bone island. A
ureteral stone is not completely excluded.
4. Numerous small calcifications in the pelvis are nonspecific but
could represent phleboliths. It would be difficult to confidently
exclude ureteral stones distally. CT imaging would be more sensitive
and specific.
